# Patient Record
Sex: Female | Born: 1960 | Race: Black or African American | Hispanic: No | Marital: Single | Smoking: Never smoker
Health system: Southern US, Community
[De-identification: ages and names within clinical notes are randomized; demographics above are authoritative.]

## PROBLEM LIST (undated history)

## (undated) DIAGNOSIS — E890 Postprocedural hypothyroidism: Secondary | ICD-10-CM

## (undated) HISTORY — DX: Postprocedural hypothyroidism: E89.0

---

## 2000-04-05 ENCOUNTER — Encounter (INDEPENDENT_AMBULATORY_CARE_PROVIDER_SITE_OTHER): Payer: Self-pay | Admitting: Specialist

## 2000-04-05 ENCOUNTER — Other Ambulatory Visit: Admission: RE | Admit: 2000-04-05 | Discharge: 2000-04-05 | Payer: Self-pay | Admitting: Obstetrics and Gynecology

## 2001-08-09 ENCOUNTER — Other Ambulatory Visit: Admission: RE | Admit: 2001-08-09 | Discharge: 2001-08-09 | Payer: Self-pay | Admitting: Obstetrics and Gynecology

## 2002-03-05 ENCOUNTER — Inpatient Hospital Stay (HOSPITAL_COMMUNITY): Admission: AD | Admit: 2002-03-05 | Discharge: 2002-03-08 | Payer: Self-pay | Admitting: Obstetrics & Gynecology

## 2002-04-12 ENCOUNTER — Other Ambulatory Visit: Admission: RE | Admit: 2002-04-12 | Discharge: 2002-04-12 | Payer: Self-pay | Admitting: Obstetrics & Gynecology

## 2003-01-18 ENCOUNTER — Encounter: Payer: Self-pay | Admitting: Family Medicine

## 2003-01-18 ENCOUNTER — Encounter: Admission: RE | Admit: 2003-01-18 | Discharge: 2003-01-18 | Payer: Self-pay | Admitting: Family Medicine

## 2003-02-05 ENCOUNTER — Encounter: Payer: Self-pay | Admitting: Thoracic Surgery

## 2003-02-07 ENCOUNTER — Encounter (INDEPENDENT_AMBULATORY_CARE_PROVIDER_SITE_OTHER): Payer: Self-pay | Admitting: *Deleted

## 2003-02-07 ENCOUNTER — Encounter: Payer: Self-pay | Admitting: Thoracic Surgery

## 2003-02-07 ENCOUNTER — Inpatient Hospital Stay (HOSPITAL_COMMUNITY): Admission: RE | Admit: 2003-02-07 | Discharge: 2003-02-09 | Payer: Self-pay | Admitting: Thoracic Surgery

## 2003-02-07 ENCOUNTER — Encounter (INDEPENDENT_AMBULATORY_CARE_PROVIDER_SITE_OTHER): Payer: Self-pay | Admitting: Specialist

## 2003-02-08 ENCOUNTER — Encounter: Payer: Self-pay | Admitting: Thoracic Surgery

## 2003-02-09 ENCOUNTER — Encounter: Payer: Self-pay | Admitting: Thoracic Surgery

## 2003-02-15 ENCOUNTER — Encounter: Payer: Self-pay | Admitting: Thoracic Surgery

## 2003-02-15 ENCOUNTER — Encounter: Admission: RE | Admit: 2003-02-15 | Discharge: 2003-02-15 | Payer: Self-pay | Admitting: Thoracic Surgery

## 2003-03-08 ENCOUNTER — Encounter: Admission: RE | Admit: 2003-03-08 | Discharge: 2003-03-08 | Payer: Self-pay | Admitting: Thoracic Surgery

## 2003-03-08 ENCOUNTER — Encounter: Payer: Self-pay | Admitting: Thoracic Surgery

## 2003-03-20 ENCOUNTER — Other Ambulatory Visit: Admission: RE | Admit: 2003-03-20 | Discharge: 2003-03-20 | Payer: Self-pay | Admitting: Obstetrics and Gynecology

## 2003-06-20 ENCOUNTER — Ambulatory Visit (HOSPITAL_COMMUNITY): Admission: RE | Admit: 2003-06-20 | Discharge: 2003-06-20 | Payer: Self-pay | Admitting: Obstetrics and Gynecology

## 2003-06-20 ENCOUNTER — Encounter: Payer: Self-pay | Admitting: Obstetrics and Gynecology

## 2003-06-24 ENCOUNTER — Encounter: Payer: Self-pay | Admitting: Obstetrics and Gynecology

## 2003-06-24 ENCOUNTER — Ambulatory Visit (HOSPITAL_COMMUNITY): Admission: RE | Admit: 2003-06-24 | Discharge: 2003-06-24 | Payer: Self-pay | Admitting: Obstetrics and Gynecology

## 2003-07-09 ENCOUNTER — Encounter: Payer: Self-pay | Admitting: Thoracic Surgery

## 2003-07-09 ENCOUNTER — Encounter: Admission: RE | Admit: 2003-07-09 | Discharge: 2003-07-09 | Payer: Self-pay | Admitting: Thoracic Surgery

## 2003-08-02 ENCOUNTER — Inpatient Hospital Stay (HOSPITAL_COMMUNITY): Admission: RE | Admit: 2003-08-02 | Discharge: 2003-08-02 | Payer: Self-pay | Admitting: Obstetrics and Gynecology

## 2003-08-02 ENCOUNTER — Encounter: Payer: Self-pay | Admitting: Obstetrics and Gynecology

## 2003-08-03 ENCOUNTER — Inpatient Hospital Stay (HOSPITAL_COMMUNITY): Admission: AD | Admit: 2003-08-03 | Discharge: 2003-08-03 | Payer: Self-pay | Admitting: Obstetrics and Gynecology

## 2003-10-01 ENCOUNTER — Ambulatory Visit (HOSPITAL_COMMUNITY): Admission: RE | Admit: 2003-10-01 | Discharge: 2003-10-01 | Payer: Self-pay | Admitting: Obstetrics and Gynecology

## 2003-10-08 ENCOUNTER — Encounter: Admission: RE | Admit: 2003-10-08 | Discharge: 2003-10-08 | Payer: Self-pay | Admitting: Thoracic Surgery

## 2003-11-04 ENCOUNTER — Inpatient Hospital Stay (HOSPITAL_COMMUNITY): Admission: AD | Admit: 2003-11-04 | Discharge: 2003-11-06 | Payer: Self-pay | Admitting: Obstetrics and Gynecology

## 2004-01-09 ENCOUNTER — Encounter: Admission: RE | Admit: 2004-01-09 | Discharge: 2004-01-09 | Payer: Self-pay | Admitting: Thoracic Surgery

## 2004-02-14 HISTORY — PX: THYROIDECTOMY: SHX17

## 2004-02-27 ENCOUNTER — Encounter (INDEPENDENT_AMBULATORY_CARE_PROVIDER_SITE_OTHER): Payer: Self-pay | Admitting: *Deleted

## 2004-02-27 ENCOUNTER — Inpatient Hospital Stay (HOSPITAL_COMMUNITY): Admission: RE | Admit: 2004-02-27 | Discharge: 2004-03-03 | Payer: Self-pay | Admitting: Thoracic Surgery

## 2004-03-10 ENCOUNTER — Encounter: Admission: RE | Admit: 2004-03-10 | Discharge: 2004-03-10 | Payer: Self-pay | Admitting: Thoracic Surgery

## 2004-03-31 ENCOUNTER — Encounter: Admission: RE | Admit: 2004-03-31 | Discharge: 2004-03-31 | Payer: Self-pay | Admitting: Thoracic Surgery

## 2004-04-28 ENCOUNTER — Encounter: Admission: RE | Admit: 2004-04-28 | Discharge: 2004-04-28 | Payer: Self-pay | Admitting: Thoracic Surgery

## 2004-08-20 ENCOUNTER — Encounter: Admission: RE | Admit: 2004-08-20 | Discharge: 2004-08-20 | Payer: Self-pay | Admitting: Thoracic Surgery

## 2004-12-10 ENCOUNTER — Other Ambulatory Visit: Admission: RE | Admit: 2004-12-10 | Discharge: 2004-12-10 | Payer: Self-pay | Admitting: Obstetrics and Gynecology

## 2005-03-22 ENCOUNTER — Ambulatory Visit (HOSPITAL_COMMUNITY): Admission: RE | Admit: 2005-03-22 | Discharge: 2005-03-22 | Payer: Self-pay | Admitting: Obstetrics and Gynecology

## 2005-09-23 ENCOUNTER — Ambulatory Visit (HOSPITAL_COMMUNITY): Admission: RE | Admit: 2005-09-23 | Discharge: 2005-09-23 | Payer: Self-pay | Admitting: Surgery

## 2005-10-04 ENCOUNTER — Ambulatory Visit (HOSPITAL_COMMUNITY): Admission: RE | Admit: 2005-10-04 | Discharge: 2005-10-04 | Payer: Self-pay | Admitting: Surgery

## 2005-12-20 ENCOUNTER — Other Ambulatory Visit: Admission: RE | Admit: 2005-12-20 | Discharge: 2005-12-20 | Payer: Self-pay | Admitting: Obstetrics and Gynecology

## 2006-01-12 ENCOUNTER — Encounter: Admission: RE | Admit: 2006-01-12 | Discharge: 2006-01-12 | Payer: Self-pay | Admitting: Obstetrics and Gynecology

## 2006-06-20 ENCOUNTER — Ambulatory Visit: Payer: Self-pay | Admitting: Family Medicine

## 2007-01-11 ENCOUNTER — Ambulatory Visit: Payer: Self-pay | Admitting: Family Medicine

## 2007-01-11 LAB — CONVERTED CEMR LAB
Hemoglobin, Urine: NEGATIVE
Nitrite: NEGATIVE
Protein, ur: NEGATIVE mg/dL
Specific Gravity, Urine: 1.018 (ref 1.005–1.03)
Urobilinogen, UA: 0.2 (ref 0.0–1.0)

## 2007-01-12 ENCOUNTER — Encounter (INDEPENDENT_AMBULATORY_CARE_PROVIDER_SITE_OTHER): Payer: Self-pay | Admitting: Family Medicine

## 2007-01-13 ENCOUNTER — Encounter: Admission: RE | Admit: 2007-01-13 | Discharge: 2007-01-13 | Payer: Self-pay | Admitting: Obstetrics and Gynecology

## 2007-10-17 ENCOUNTER — Ambulatory Visit: Payer: Self-pay | Admitting: Family Medicine

## 2007-10-17 DIAGNOSIS — E039 Hypothyroidism, unspecified: Secondary | ICD-10-CM

## 2007-12-20 ENCOUNTER — Ambulatory Visit: Payer: Self-pay | Admitting: Family Medicine

## 2007-12-22 ENCOUNTER — Telehealth (INDEPENDENT_AMBULATORY_CARE_PROVIDER_SITE_OTHER): Payer: Self-pay | Admitting: *Deleted

## 2008-02-22 ENCOUNTER — Ambulatory Visit: Payer: Self-pay | Admitting: Family Medicine

## 2008-02-25 LAB — CONVERTED CEMR LAB
T3 Uptake Ratio: 42.6 % — ABNORMAL HIGH (ref 22.5–37.0)
T3, Free: 3 pg/mL (ref 2.3–4.2)
TSH: 0.04 microintl units/mL — ABNORMAL LOW (ref 0.35–5.50)

## 2008-02-26 ENCOUNTER — Encounter (INDEPENDENT_AMBULATORY_CARE_PROVIDER_SITE_OTHER): Payer: Self-pay | Admitting: *Deleted

## 2009-03-13 ENCOUNTER — Encounter: Admission: RE | Admit: 2009-03-13 | Discharge: 2009-03-13 | Payer: Self-pay | Admitting: Internal Medicine

## 2010-03-03 ENCOUNTER — Ambulatory Visit: Payer: Self-pay | Admitting: Vascular Surgery

## 2010-03-03 ENCOUNTER — Ambulatory Visit (HOSPITAL_COMMUNITY): Admission: RE | Admit: 2010-03-03 | Discharge: 2010-03-03 | Payer: Self-pay | Admitting: Internal Medicine

## 2010-05-13 ENCOUNTER — Ambulatory Visit (HOSPITAL_COMMUNITY): Admission: RE | Admit: 2010-05-13 | Discharge: 2010-05-13 | Payer: Self-pay | Admitting: Internal Medicine

## 2010-05-13 ENCOUNTER — Ambulatory Visit: Payer: Self-pay | Admitting: Vascular Surgery

## 2010-05-13 ENCOUNTER — Encounter (INDEPENDENT_AMBULATORY_CARE_PROVIDER_SITE_OTHER): Payer: Self-pay | Admitting: Internal Medicine

## 2010-06-09 ENCOUNTER — Encounter: Admission: RE | Admit: 2010-06-09 | Discharge: 2010-06-09 | Payer: Self-pay | Admitting: Internal Medicine

## 2010-12-05 ENCOUNTER — Encounter: Payer: Self-pay | Admitting: Thoracic Surgery

## 2011-04-02 NOTE — H&P (Signed)
[facility]  Patient:    Yesenia Salas, Yesenia Salas Visit Number: [id number] MRN: [id number]          Service Type: OBS Location: [facility] Attending Physician:  Esmeralda Arthur Dictated by:   Lenoard Aden, M.D. Adm. Date:  03/05/02                           History and Physical  CHIEF COMPLAINT:              Post-dates pregnancy, for induction.  HISTORY OF PRESENT ILLNESS:   A 50 year old African-American female, G1, P0, EDD February 22, 2002, who presents post dates for induction.  PAST MEDICAL HISTORY:         Remarkable for no pregnancies.  ALLERGIES:                    No known drug allergies.  MEDICATIONS:                  Prenatal vitamins.  GYNECOLOGIC HISTORY:          History of intramural uterine fibroids.  FAMILY HISTORY:               Hypertension.  SOCIAL HISTORY:               Nonsmoker, nondrinker.  Denies domestic or physical violence.  PRENATAL LABORATORY DATA:     Blood type AB positive.  Hemoglobin electrophoresis within normal limits.  Rubella immune.  Hepatitis B surface antigen negative.  HIV nonreactive.  GC, Chlamydia negative.  PHYSICAL EXAMINATION:  GENERAL:                      She is a well-developed, well-nourished African-American female in no apparent distress.  HEENT:                        Normal.  CHEST:                        Lungs clear.  CARDIAC:                      Regular rate and rhythm.  ABDOMEN:                      Soft, gravid, nontender.  PELVIC:                       Cervix is closed, 3 cm, long, soft.  Vertex -2.  EXTREMITIES:                  No cords.  NEUROLOGIC:                   Nonfocal.  IMPRESSION:                   1. Post-dates intrauterine pregnancy, for                                  cervical ripening and induction.                               2. Advanced maternal age, declined  amniocentesis.  PLAN:                         Proceed with  cervical ripening and induction. Cytotec 25 mcg to be placed.  Fetal heart rate tracing reassuring at this time.  The procedure is explained to the patient.  She acknowledges and will proceed. Dictated by:   Lenoard Aden, M.D. Attending Physician:  Esmeralda Arthur DD:  03/05/02 TD:  03/05/02 Job: [id number]

## 2011-04-02 NOTE — H&P (Signed)
NAME:  Yesenia Salas, Yesenia Salas                           ACCOUNT NO.:  [id number]   MEDICAL RECORD NO.:  [id number]                   PATIENT TYPE:  INP   LOCATION:  9169                                 FACILITY:  [facility]   PHYSICIAN:  Crist Fat. Rivard, M.D.              DATE OF BIRTH:  12-18-1960   DATE OF ADMISSION:  11/04/2003  DATE OF DISCHARGE:                                HISTORY & PHYSICAL   HISTORY OF PRESENT ILLNESS:  Yesenia Salas is a 50 year old married black  female gravida 3 para 1-0-1-1 who presents for induction of labor secondary  to large substernal mass in order to promote a daytime delivery and to have  the full obstetrical team available.  The patient denies any leaking or  vaginal bleeding.  She denies any nausea, vomiting, headaches, or visual  disturbances.  She does report positive fetal movement.  Her pregnancy has  been followed at [facility] by the M.D. service and has been  complicated by a large mediastinal mass that has been diagnosed as a goiter.  It has been stable throughout this pregnancy but is causing some tracheal  compression.  Other complications of this pregnancy include advanced  maternal age, history of fibroids, and increased Downs syndrome risk on quad  screen and normal ultrasound.   OBSTETRICAL AND GYNECOLOGICAL HISTORY:  She is a gravida 3 para 1-0-1-1 who  had a miscarriage in 1984 and delivered a viable female infant in April 2003  who weighed 8 pounds 2 ounces at [redacted] weeks gestation following a 24-hour  labor.  She was induced secondary to being overdue and was born at [facility] [place].  The baby's name is Musa.  Other GYN history  significant for history of fibroids.   GENERAL MEDICAL HISTORY:  She has no known drug allergies.  She reports  having had the usual childhood diseases.  She has no medical problems other  than the chest mass from a goiter that has been followed by Dr. Edwyna Shell.  Her  only surgical history was  for wisdom teeth in 1999.   GENETIC HISTORY:  Significant for the fact that she is over age 2 and her  husband is over age 16.   SOCIAL HISTORY:  She is married to Gary who is involved and  supportive.  They are of the Islamic faith.  They speak English and a  language called [place]er[place] but do not require an interpreter.   PRENATAL LABORATORY DATA:  Blood type is AB positive, antibody screen is  negative.  Sickle cell trait is negative.  Syphilis is nonreactive.  Rubella  is positive.  Hepatitis B surface antigen is negative.  HIV is nonreactive.  GC and chlamydia both negative.  Pap was within normal limits.  Her quad  screen showed elevated Downs syndrome risk but she declined amniocentesis.  Ultrasound at [facility] was within normal limits.  Her 36-week beta  strep was negative.   PHYSICAL EXAMINATION:  VITAL SIGNS:  Stable; she is afebrile.  HEENT:  Grossly within normal limits.  HEART:  Regular rhythm and rate.  CHEST:  Clear.  BREASTS:  Soft and nontender.  ABDOMEN:  Gravid with uterine contractions that are irregular.  Fetal heart  rate is reactive and reassuring.  PELVIC:  Shows 1 cm, 50%, posterior, vertex at -3.  EXTREMITIES:  Within normal limits.   ASSESSMENT:  1. Intrauterine pregnancy at [redacted] weeks gestation with large substernal     goiter.  2. Negative group B Streptococcus.   PLAN:  Admit for induction of labor with artificial rupture of membranes and  Pitocin per Dr. Dois Davenport Rivard.     Concha Pyo. Duplantis, C.N.M.              Crist Fat Rivard, M.D.    SJD/MEDQ  D:  11/04/2003  T:  11/04/2003  Job:  [id number]

## 2011-04-02 NOTE — Op Note (Signed)
NAME:  Yesenia Salas, Yesenia Salas                           ACCOUNT NO.:  [id number]   MEDICAL RECORD NO.:  [id number]                   PATIENT TYPE:  INP   LOCATION:  2899                                 FACILITY:  [facility]   PHYSICIAN:  Ines Bloomer, M.D.              DATE OF BIRTH:  August 05, 1961   DATE OF PROCEDURE:  02/07/2003  DATE OF DISCHARGE:                                 OPERATIVE REPORT   PREOPERATIVE DIAGNOSIS:  Anterior mediastinal mass.   POSTOPERATIVE DIAGNOSIS:  Anterior mediastinal mass, probable substernal  thyroid goiter.   OPERATION:  Video bronchoscopy and left mediastinotomy.   DESCRIPTION OF PROCEDURE:  After general anesthesia, the fiberoptic  bronchoscope was passed through the endotracheal tube.  The carina was in  the midline; however, there was evidence of distal compression of the  trachea.  Pictures were taken of this.  The right upper lobe, right middle  lobe, and right lower lobe orifices were normal.  The left upper lobe and  left lower lobe orifices were normal.  Cytologies and cultures were taken  and the videoscope was removed.  Then, the anterior neck and chest were  prepped and draped in the usual sterile manner.  This patient presented with  symptoms of a cough and mild dyspnea.  A chest x-ray revealed an anterior  mediastinal mass.  It was thought that this might be a thymoma, germ cell  tumor, or lymphoma.  We decided to do a direct biopsy to determine what was  going on, particularly if it was a lymphoma or germ cell tumor in order to  get an adequate amount tissue for workup.  A 3-cm incision was made over the  second intercostal space at the left sternal border and dissection was  carried down to the subcutaneous tissue.  The pectoral muscles were split  and the second intercostal space was entered.  The lesion was then seen and  you could identify the phrenic nerve laterally and the lesion was opened  medially to the phrenic nerve and lateral to  the mammary artery and  dissection was carried down.  There were a lot of veins on top of the mass.  One of the veins was sutured with 3-0 silk and clipped and divided to expose  the mass.  It looked to be reddish in color and looked to be thyroid.  A  substernal thyroid goiter was suspected.  Several biopsies were done and  frozen section revealed that this is thyroid tissue.  The bleeding was  controlled with electrocautery.  Surigicel was packed into the biopsy site.  A chest tube was brought in laterally and put into the intercostal space.  The chest was closed with 2-0 Vicryl in the subcutaneous tissue, 3-0 Vicryl  with a subcuticular stitch, and Dermabond to the skin.  The patient was  returned to the recovery room in stable condition.  Ines Bloomer, M.D.    DPB/MEDQ  D:  02/07/2003  T:  02/08/2003  Job:  [id number]   cc:   Leanne Chang, M.D.  [address]  [place]  [place] [address]  Fax: [phone]

## 2011-04-02 NOTE — Op Note (Signed)
NAME:  Yesenia Salas, Yesenia Salas                           ACCOUNT NO.:  [id number]   MEDICAL RECORD NO.:  [id number]                   PATIENT TYPE:  INP   LOCATION:  3314                                 FACILITY:  [facility]   PHYSICIAN:  Velora Heckler, M.D.                DATE OF BIRTH:  26-Feb-1961   DATE OF PROCEDURE:  02/27/2004  DATE OF DISCHARGE:                                 OPERATIVE REPORT   PREOPERATIVE DIAGNOSIS:  Thyroid goiter with large substernal component.   POSTOPERATIVE DIAGNOSIS:  Thyroid goiter with large substernal component.   PROCEDURE:  1. Total thyroidectomy.  2. Median sternotomy.  3. Resection substernal goiter.   CO-SURGEONS:  Ines Bloomer, M.D.  Velora Heckler, M.D.   ANESTHESIA:  General.   ESTIMATED BLOOD LOSS:  500 mL.   PREPARATION:  Betadine.   COMPLICATIONS:  None.   INDICATIONS FOR PROCEDURE:  The patient is a 50 year old black female  referred by Dr. Dewayne Shorter to my practice for evaluation of thyroid goiter  with substernal mass.  The patient had developed a chronic cough in early  2004.  Chest x-ray obtained by her primary care physician showed a large  mediastinal mass.  The patient was seen by Dr. Edwyna Shell.  CT scan shows a  large substernal mass suspicious for substernal thyroid goiter.  Needle  biopsy was obtained and showed changes consistent with hyperplastic nodules.  However, the patient became pregnant.  She carried to term and delivered her  child in December 2004.  She returned to see Dr. Edwyna Shell where a CT scan of  the chest showed interval enlargement of the mediastinal mass to a maximum  dimension of approximately 14 cm.  The patient now comes to surgery for  total thyroidectomy and resection of mediastinal goiter.   DESCRIPTION OF PROCEDURE:  The procedure was done in OR 7 at [facility].  The patient was brought to the operating room and placed  in a supine position on the operating table.  Following  administration of  general anesthesia, the patient was prepped and draped in the usual strict  aseptic fashion.  After ascertaining an adequate level of anesthesia had  been obtained, a Kocher incision was made with a #15 blade.  Dissection was  carried down through subcutaneous tissues and platysma.  Hemostasis was  obtained with electrocautery.  There was marked engorgement of the external  jugular veins.  Skin flaps were developed cephalad and caudad from the  thyroid notch to the sternal notch border.  A Mahorner self-retaining  retractor was placed for exposure.  Strap muscles were incised in the  midline and dissection was begun on the left side.  The strap muscles were  reflected laterally.  The left thyroid lobe was carefully dissected out.  Large venous tributaries were ligated in continuity with 3-0 silk ties and  medium Liga clips and  divided.  The superior pole vessels are dissected out.  These are ligated in continuity with 2-0 silk ties and medium Liga clips and  divided.  The superior parathyroid gland is identified and preserved on the  left.  The gland is rolled medially.  Recurrent laryngeal nerve is  identified and preserved.  Branches of the inferior thyroid artery are  divided between small Liga clips.  The ligament of Allyson Sabal is transected and  the gland is rolled up and onto the anterior surface of the trachea.  The  inferior pole of the gland extends beneath the clavicle consistent with  substernal goiter.  A pack is placed on the left neck.   Next, we turned our attention to the right side.  Again, the strap muscles  are reflected laterally.  Large venous tributaries are ligated in continuity  with 3-0 silk ties and medium Liga clips and divided.  The superior pole  vessels are dissected out, ligated in continuity with 2-0 silk ties and  medium Liga clips, and divided.  The gland is rolled medially.  The  parathyroid tissue was identified and preserved.  Branches of  the inferior  thyroid artery are divided between small and medium Liga clips.  The  ligament of Allyson Sabal is transected and the gland was rolled up and onto the  anterior trachea.  It is excised off the anterior trachea using the  electrocautery for hemostasis.  Dissection was carried inferiorly.  The  inferior pole appears to blend into the substernal goiter component.  Care  was taken to avoid injury to recurrent nerves.  After complete mobilization  of the cervical thyroid, the substernal component was addressed.  After  careful digital palpation in the plane around the substernal portion of the  goiter, it is noted that the substernal portion is quite large, well  exceeding the 10 cm reach of an index finger.  Also, the gland appears  somewhat fixed in the left anterior chest and this is not able to be  visualized through the cervical approach.  Therefore, a decision is made to  proceed with median sternotomy.  This will be dictated under a separate  operative report dictated by Dr. Karle Plumber.  He will also dictate the  resection of the mediastinal mass at that time.  The patient tolerated the  first portion of this procedure very well.   Following median sternotomy and resection of the mediastinal component of  the thyroid goiter, the wounds were closed.  The median sternotomy was  closed as dictated by Dr. Edwyna Shell.  The cervical incision was closed by  placing Surgicel over the area of the recurrent laryngeal nerves  bilaterally.  The strap muscles were reapproximated in the midline with  interrupted 3-0 Vicryl sutures.  The platysma was closed with interrupted 3-  0 Vicryl sutures.  The skin was closed with stainless steel staples.  Dr.  Edwyna Shell will dictate closure of the median sternotomy under a separate  report.   The patient tolerated the procedure well.                                               Velora Heckler, M.D.   TMG/MEDQ  D:  02/27/2004  T:  02/27/2004  Job:   [id number]   cc:   Ines Bloomer, M.D.  [address]  [place]  [place] [address]   [facility]

## 2011-04-02 NOTE — Consult Note (Signed)
NAME:  Yesenia Salas, Yesenia Salas                           ACCOUNT NO.:  [id number]   MEDICAL RECORD NO.:  [id number]                   PATIENT TYPE:  INP   LOCATION:  9102                                 [facility]ACILITY[facility]   PHYSICIAN:  Lorre Munroe., M.D.            DATE OF BIRTH:  1961/08/05   DATE OF CONSULTATION:  11/05/2003  DATE OF DISCHARGE:                                   CONSULTATION   CONSULTING PHYSICIAN:  Lebron Conners, M.D.   REFERRING PHYSICIAN:  Dois Davenport A. Rivard, M.D.   CHIEF COMPLAINT:  Anal pain.   HISTORY OF PRESENT ILLNESS:  The patient is a recently postpartum 50-year-  old black female who has developed rather severe anal pain which is made  worse with each bowel movement.  Since delivery, she has not been  constipated.  She is anticipating going home from the hospital tomorrow.  She has not had any bleeding.  She has had a similar problem but not quite  as severe with each proceeding pregnancy.  She does have constipation from  time to time but has no chronic GI problems.  She is a healthy woman and has  no serious chronic ailments.  The delivery went well.  The baby is healthy.   PAST MEDICAL HISTORY:  As above.  No serious problems noted.   FAMILY HISTORY:  Unremarkable.   CHILDHOOD ILLNESSES:  Unremarkable.   No previous anal surgery or GI surgery.   PHYSICAL EXAMINATION:  VITAL SIGNS:  The patient has normal vital signs.  Normal temperature.  GENERAL:  No acute distress.  She appears healthy.  MENTAL STATUS:  Normal.  ABDOMEN:  Nontender, nondistended.  RECTAL:  The perianal skin shows some edematous but not particularly  remarkable external hemorrhoids which do not show evidence of thrombosis as  manifested by hard areas of tightness or any induration.  There is no  redness.  No evident drainage of pus.  Digital exam is difficult, but I can  tell that there is no impaction.   IMPRESSION:  Delivery related edematous hemorrhoids.   RECOMMENDATIONS:  I  think this is best treated by conservative measures of  topical cream, cyst baths and soaks and good hygiene, high fiber diet, use  of laxatives judiciously.  I gave the patient my card and will be happy to  follow her up in the office if there is not prompt improvement which should  be expected.                                               Lorre Munroe., M.D.    WB/MEDQ  D:  11/05/2003  T:  11/05/2003  Job:  [id number]   cc:   Dois Davenport A. Rivard, M.D.  [address]  [place] [address]  Fax: [phone]

## 2011-04-02 NOTE — Discharge Summary (Signed)
NAME:  Yesenia Salas, Yesenia Salas                           ACCOUNT NO.:  [id number]   MEDICAL RECORD NO.:  [id number]                   PATIENT TYPE:  INP   LOCATION:  2019                                 FACILITY:  [facility]   PHYSICIAN:  Ines Bloomer, M.D.              DATE OF BIRTH:  August 24, 1961   DATE OF ADMISSION:  02/27/2004  DATE OF DISCHARGE:  03/03/2004                                 DISCHARGE SUMMARY   HISTORY OF PRESENT ILLNESS:  Yesenia Salas is a very pleasant 49 year old black  female referred for evaluation of a thyroid goiter and substernal mass.  The  patient initially developed a chronic cough in the early part of 2004.  Her  primary care physician treated her; however, when the cough failed to  resolve, a chest x-ray was obtained which revealed a mediastinal mass.  She  was initially referred to Dr. Edwyna Shell, and CT scan at that time showed a  large substernal mass suspicious for substernal thyroid goiter.  Needle  biopsy was obtained which showed changes consistent with hyperplastic  nodules and thyroid goiter.  The patient did, however, become pregnant  before the mass could be resected.  She carried her child to term and  delivered in December 2004.  She was subsequently reevaluated by Dr. Edwyna Shell  as well as Dr. Gerrit Friends and felt to be a candidate for resection of the mass  this hospitalization.   PAST MEDICAL HISTORY:  Childbirth December 2004.   MEDICATIONS ON ADMISSION:  None.   ALLERGIES:  None.   Family History, Social History, Review of Symptoms, and Physical  Examination:  Please see the History and Physical done at the time of  admission.   HOSPITAL COURSE:  The patient was admitted electively on 27 February 2004 and  taken to the operating room where she underwent the following procedures: 1)  Total thyroidectomy, 2) median sternotomy, 3) resection of substernal  goiter.  The patient had the cosurgeons of Dr. Gerrit Friends and Dr. Edwyna Shell,  tolerated the procedure well, and  was taken the to the postanesthesia care  unit instable condition.   Postoperative Hospital Course:  The patient has done quite well.  All  routine lines, monitors, and drainage devices have been discontinue in the  standard fashion.  She does have a postoperative anemia.  She has been  started on iron replacement.  Her calcium has been monitored closely  postoperatively, and she has been started on supplementation.  Most recent  calcium is 9.2 on March 01, 2004, and an additional check will be obtained  prior to final discharge.  Pathology has returned, and the final diagnosis  is multinodular goiter with no evidence of malignancy.  The patient has been  started on synthetic thyroid replacement.  Currently she is hemodynamically  stable.  Incision is healing well without signs of infection.  She is  tolerating diet and other activities commensurate for  level of postoperative  convalescence and overall is tentatively felt to be quite stable for  discharge in the morning of March 03, 2004, pending morning round  reevaluation.   CONDITION ON DISCHARGE:  Stable and improving.   DISCHARGE MEDICATIONS:  1. Synthroid 100 mcg daily.  2. Calcium carbonate with vitamin D 1000 mg daily.  3. Iron sulfate 325 mg twice daily with meals.  4. Colace p.r.n. as needed for pain.  5  Tylox 1 to 2 every 4 to 6 hours as needed.   DISCHARGE INSTRUCTIONS:  The patient received written instructions in regard  to medications, activity, diet, wound care, and followup.  Followup will  include Dr. Gerrit Friends at his office, to be arranged; Dr. Edwyna Shell April 26 at  3:45 with a chest x-ray from the [facility]   FINAL DIAGNOSES:  1. Substernal goiter, multinodular goiter.  No evidence of malignancy.  2. Postoperative anemia.  3. Most recent hemoglobin and hematocrit dated March 01, 2004, are 8.4 and     24.7, respectively.      Rowe Clack, P.A.-C.                    Ines Bloomer,  M.D.    Sherryll Burger  D:  03/02/2004  T:  03/03/2004  Job:  [id number]   cc:   Velora Heckler, M.D.  [address] [place]  [place] [address]  Fax: [phone]   [facility], [place]

## 2011-04-02 NOTE — H&P (Signed)
NAME:  Yesenia Salas, Yesenia Salas                          ACCOUNT NO.:  [id number]   MEDICAL RECORD NO.:  [id number]                   PATIENT TYPE:  INP   LOCATION:  NA                                   FACILITY:  [facility]   PHYSICIAN:  Ines Bloomer, M.D.              DATE OF BIRTH:  Dec 22, 1960   DATE OF ADMISSION:  02/07/2003  DATE OF DISCHARGE:                                HISTORY & PHYSICAL   CHIEF COMPLAINT:  Chronic cough, shortness of breath, and anterior  mediastinal mass.   HISTORY OF PRESENT ILLNESS:  This is a 50 year old black female, originally  from [place], who has had complaints of a cough for around one year.  It has  been nonproductive.  Over the last six to eight weeks, the cough has gotten  much worse and she has had mild associated shortness of breath.  She was  referred to Dr. Ines Bloomer after a chest CT showed an anterior  mediastinal lung mass.  After review, Dr. Ines Bloomer felt that there  may be some tracheal compression and that an open biopsy was needed.  He  discussed this with her and it is agreed to proceed with a bronchoscopy and  mediastinoscopy or a Chamberland procedure on February 07, 2003.   Today, the patient has no new complaints and has been feeling in her usual  state of health except for her worsening cough and shortness of breath.   PAST MEDICAL HISTORY:  Essentially negative.  She has denied any history of  heart disease, hypertension, diabetes, COPD, asthma, GI problems, GU  problems, stroke.   PAST SURGICAL HISTORY:  Also negative except for oral surgery.   SOCIAL HISTORY:  She has never been a smoker nor uses alcohol.  She moved  here from [place] in [place] eight years ago.  She is married for one year now  and has a 44-year-old son.  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  She lives here in [place].   FAMILY HISTORY:  Mother is 49 years old and healthy.  Father died at age 46  from  unknown cause.  She has five brothers and seven sisters-all are  reportedly healthy.   REVIEW OF SYMPTOMS:  Positive for chronic cough and shortness of breath.  She denies fever, chills, night time sweats, weight loss, chest pain,  palpitations, GI problems, GU problems, stroke, TIA symptoms, paroxysm  nocturnal dyspnea, palpitations, lower extremity edema.   MEDICATIONS:  None.   ALLERGIES:  None.   PHYSICAL EXAMINATION:  GENERAL:  Well-developed, well-nourished pleasant  black female in no acute distress.  Alert and oriented x 3.  VITAL SIGNS:  Blood pressure 128/76, heart rate 90 and regular, respirations  14 and unlabored.  HEENT:  Normocephalic and atraumatic.  Pupils are equal, round, and reactive  to light.  Extraocular movements intact.  Visual  acuity is intact.  Oropharynx is grossly normal.  There are no dentures.  NECK:  Supple.  No adenopathy.  There are 2+ carotids with no bruits.  LUNGS:  Clear and equal anteriorly and posteriorly.  CHEST:  Symmetrical.  There are no adventitious lung sounds.  Breathing is  normal.  CARDIOVASCULAR:  Regular rate and rhythm.  S1 and S2 with no murmur.  PULSES:  There are 2+ pedal pulses.  There is no lower extremity edema.  ABDOMEN:  Soft.  No hepatosplenomegaly.  Positive bowel sounds.  Nontender,  nondistended, and no masses.  NEUROLOGICAL:  Cranial nerves are intact.  There were no focal deficits.  MUSCULOSKELETAL:  There is +5/5 strength throughout.  SKIN:  Normal.   ASSESSMENT/PLAN:  This is a 50 year old black female with an anterior  mediastinal mass and associated cough and shortness of breath.  Dr. Ines Bloomer has recommended bronchoscopy and mediastinoscopy or possibly a  Chamberland procedure.  This is scheduled for February 07, 2003 in order to get  a diagnosis of this mass.     Lissa Merlin, P.A.                          Ines Bloomer, M.D.    Alwyn Ren  D:  02/05/2003  T:  02/05/2003  Job:  [id number]

## 2011-04-02 NOTE — Op Note (Signed)
NAME:  Yesenia Salas, Yesenia Salas                           ACCOUNT NO.:  [id number]   MEDICAL RECORD NO.:  [id number]                   PATIENT TYPE:  INP   LOCATION:  3314                                 FACILITY:  [facility]   PHYSICIAN:  Ines Bloomer, M.D.              DATE OF BIRTH:  03-08-1961   DATE OF PROCEDURE:  02/27/2004  DATE OF DISCHARGE:                                 OPERATIVE REPORT   PREOPERATIVE DIAGNOSIS:  Massive substernal thyroid goiter, multinodular  goiter.   POSTOPERATIVE DIAGNOSIS:  Massive substernal thyroid goiter, multinodular  goiter.   PROCEDURE:  Resection total thyroidectomy with resection of goiter.   SURGEON:  Ines Bloomer, M.D. and Velora Heckler, M.D.   INDICATIONS FOR PROCEDURE:  This 50 year old African-American female was  found to have an enlarging substernal mass.  The biopsy done revealed  thyroid tissue.  Unfortunately, we could not do the case because she was  pregnant.  We waited until she had delivered and rechecked the CT scan which  showed the mass increased 2 cm from 12 to 14 cm.  It was decided to do a  total thyroidectomy and median sternotomy was thought would have to be done.   DESCRIPTION OF PROCEDURE:  After general anesthesia and prepping and draping  the chest, a neck incision was made by Dr. Gerrit Friends and dissection was carried  down dissecting the strap muscles and Dr. Gerrit Friends who will dictate this  separately dissected out the inferior pole of the thyroid and down to the  inferior pole of the thyroid.  Off the inferior pole of the thyroid, the  substernal goiter could be seen and went below the sternum, but could not be  removed without doing a sternotomy.  An incision was made T-ing off the  Kocher incision and going down to the xiphoid.  The incision was divided  with electrocautery down to the sternum.  The sternum was then split with  the sternal saw and retractor was inserted after stopping all bleeding with  electrocoagulation and thrombin and Gelfoam.  After this had been done, the  lesion was dissected out, first dissecting out on the right side capsule.  There were several large blood vessels that had to be clamped and ligated  with 2-0 and 0 silk and dissection was carried to the left at the previous  biopsy but it was stuck to the area of the previous biopsy. This had to be  dissected off.  The innominate vein went right under the area of the  previous biopsy and this had to be dissected away until finally the whole  mass was removed.  Multiple large vessels that were feeding the mass were  clamped with hemostats and Kelly clamps as well as Vanderbilt.  After the  entire mass was removed, these clamps were tied with 0 and 2-0 silk.  One  area was bleeding where the previous  biopsy was and had to be oversewn with  2-0 silk in a horizontal mattress fashion as well as a figure-of-eight  fashion.  After all bleeding had been controlled, two chest tubes were  brought in through the separate stab wounds in the inferior portion of the  wound. These were 28 chest tubes that were placed with 0 silk.  The chest  was then closed with six sternal wires, #1 Vicryl in the muscle layer, and  so the fascia was  closed with #1 Vicryl, the On-Q catheters were placed underneath the fascia  stitches, and then the subcutaneous tissue closed with 2-0 Vicryl and the  skin with 3-0 Vicryl and Visistat staples.  Dry sterile dressing applied.  The patient returned to the recovery room in stable condition.                                               Ines Bloomer, M.D.    DPB/MEDQ  D:  02/27/2004  T:  02/28/2004  Job:  [id number]   cc:   Velora Heckler, M.D.  [address] [place]  [place] [address]  Fax: [phone]

## 2011-04-02 NOTE — Discharge Summary (Signed)
   NAME:  Yesenia Salas, Yesenia Salas                           ACCOUNT NO.:  [id number]   MEDICAL RECORD NO.:  [id number]                   PATIENT TYPE:  INP   LOCATION:  2034                                 FACILITY:  [facility]   PHYSICIAN:  Ines Bloomer, M.D.              DATE OF BIRTH:  05/22/1961   DATE OF ADMISSION:  02/07/2003  DATE OF DISCHARGE:  02/09/2003                                 DISCHARGE SUMMARY   FINAL DIAGNOSIS:  Substernal thyroid goiter.   PROCEDURES:  Video bronchoscopy with left mediastinotomy by Dr. Edwyna Shell on  02/07/2003 with biopsy of mass.   BRIEF HISTORY:  The patient is a 50 year old black female from [place] who  has had a long history of cough and shortness of breath.  She was referred  to Dr. Edwyna Shell after an anterior mediastinal mass was seen on a CT scan.  He  felt that there may have been some tracheal compression and that an open  biopsy was needed.   HOSPITAL COURSE:  She was admitted to the hospital for the biopsy on  02/05/2003.  She tolerated the procedure well.  There were no complications.  Frozen section and subsequent pathology revealed substernal thyroid goiter.  On postop day two, she was doing quite well; there were no postoperative  complications, chest x-ray was okay, her wound was healing well, her lungs  were clear, her O2 sat was alright, and she was discharged home in stable  condition.  She was also found to have elevated T3 and T4 with a TSH of  0.35.   DISCHARGE MEDICATIONS:  Tylox one to two every four to six hours p.r.n. for  pain.   CONDITION:  Stable.   DISPOSITION:  Home.   SPECIAL INSTRUCTIONS:  1. She was told to avoid driving, working, heavy lifting, strenuous     activity.  2. She was told that she could shower and clean her wound gently daily with     soap and water, use her breathing exerciser daily.  3. She was told to get a chest x-ray at [facility] one     hour before seeing Dr. Edwyna Shell and to bring it  with her and see Dr.     Edwyna Shell.   FOLLOW UP:  Dr. Edwyna Shell one week after discharge; office will call with  appointment.     Lissa Merlin, P.A.                          Ines Bloomer, M.D.    Alwyn Ren  D:  04/02/2003  T:  04/03/2003  Job:  [id number]

## 2011-07-08 ENCOUNTER — Other Ambulatory Visit: Payer: Self-pay | Admitting: Internal Medicine

## 2011-07-08 DIAGNOSIS — Z1231 Encounter for screening mammogram for malignant neoplasm of breast: Secondary | ICD-10-CM

## 2013-02-26 ENCOUNTER — Encounter (HOSPITAL_COMMUNITY): Payer: Self-pay | Admitting: Emergency Medicine

## 2013-02-26 ENCOUNTER — Emergency Department (HOSPITAL_COMMUNITY)
Admission: EM | Admit: 2013-02-26 | Discharge: 2013-02-26 | Disposition: A | Discharge status: Nursing Home (Includes ICF) | Payer: Medicaid Other | Source: Home / Self Care | Attending: Emergency Medicine | Admitting: Emergency Medicine

## 2013-02-26 ENCOUNTER — Encounter (HOSPITAL_COMMUNITY): Payer: Self-pay | Admitting: *Deleted

## 2013-02-26 ENCOUNTER — Emergency Department (HOSPITAL_COMMUNITY): Payer: Medicaid Other

## 2013-02-26 ENCOUNTER — Emergency Department (HOSPITAL_COMMUNITY)
Admission: EM | Admit: 2013-02-26 | Discharge: 2013-02-26 | Disposition: A | Discharge status: Home / Self Care | Payer: Medicaid Other | Attending: Emergency Medicine | Admitting: Emergency Medicine

## 2013-02-26 DIAGNOSIS — Z79899 Other long term (current) drug therapy: Secondary | ICD-10-CM | POA: Insufficient documentation

## 2013-02-26 DIAGNOSIS — I209 Angina pectoris, unspecified: Secondary | ICD-10-CM

## 2013-02-26 DIAGNOSIS — R5381 Other malaise: Secondary | ICD-10-CM | POA: Insufficient documentation

## 2013-02-26 DIAGNOSIS — J9859 Other diseases of mediastinum, not elsewhere classified: Secondary | ICD-10-CM

## 2013-02-26 DIAGNOSIS — H539 Unspecified visual disturbance: Secondary | ICD-10-CM | POA: Insufficient documentation

## 2013-02-26 DIAGNOSIS — R222 Localized swelling, mass and lump, trunk: Secondary | ICD-10-CM | POA: Insufficient documentation

## 2013-02-26 DIAGNOSIS — R0789 Other chest pain: Secondary | ICD-10-CM | POA: Insufficient documentation

## 2013-02-26 DIAGNOSIS — R209 Unspecified disturbances of skin sensation: Secondary | ICD-10-CM | POA: Insufficient documentation

## 2013-02-26 DIAGNOSIS — R531 Weakness: Secondary | ICD-10-CM

## 2013-02-26 DIAGNOSIS — G459 Transient cerebral ischemic attack, unspecified: Secondary | ICD-10-CM

## 2013-02-26 LAB — CBC
HCT: 35.3 % — ABNORMAL LOW (ref 36.0–46.0)
RDW: 12.4 % (ref 11.5–15.5)
WBC: 5.1 10*3/uL (ref 4.0–10.5)

## 2013-02-26 LAB — BASIC METABOLIC PANEL
BUN: 7 mg/dL (ref 6–23)
CO2: 26 mEq/L (ref 19–32)
Chloride: 103 mEq/L (ref 96–112)
GFR calc non Af Amer: >90 mL/min (ref >90)
Glucose, Bld: 91 mg/dL (ref 70–99)
Potassium: 3.6 mEq/L (ref 3.5–5.1)
Sodium: 140 mEq/L (ref 135–145)

## 2013-02-26 LAB — POCT I-STAT TROPONIN I: Troponin i, poc: 0.02 ng/mL (ref 0.00–0.08)

## 2013-02-26 MED ORDER — GADOBENATE DIMEGLUMINE 529 MG/ML IV SOLN
15.0000 mL | Freq: Once | INTRAVENOUS | Status: AC | PRN
  Administered 2013-02-26: 15 mL via INTRAVENOUS

## 2013-02-26 MED ORDER — ASPIRIN 81 MG PO CHEW
324.0000 mg | CHEWABLE_TABLET | Freq: Once | ORAL | Status: AC
  Administered 2013-02-26: 324 mg via ORAL

## 2013-02-26 MED ORDER — NITROGLYCERIN 0.4 MG SL SUBL
SUBLINGUAL_TABLET | SUBLINGUAL | Status: AC
  Filled 2013-02-26: qty 25

## 2013-02-26 MED ORDER — NITROGLYCERIN 0.4 MG SL SUBL
0.4000 mg | SUBLINGUAL_TABLET | SUBLINGUAL | Status: AC | PRN
  Administered 2013-02-26: 0.4 mg via SUBLINGUAL

## 2013-02-26 MED ORDER — ASPIRIN 81 MG PO CHEW
CHEWABLE_TABLET | ORAL | Status: AC
  Filled 2013-02-26: qty 4

## 2013-02-26 MED ORDER — SODIUM CHLORIDE 0.9 % IV SOLN: INTRAVENOUS | Status: DC

## 2013-02-26 NOTE — ED Notes (Signed)
Per EMS - pt c/o CP and numbness in face X 10 days. Urgent administered 324 ASA. [facility] started a 20G in left forearm and administered 1 nitro. Pt sts she has had relief from pain. BP 127/86 HR 72 RR 16. No cardiac hx.

## 2013-02-26 NOTE — ED Provider Notes (Addendum)
Chief Complaint:   Chief Complaint  Patient presents with  . Numbness    History of Present Illness:   Yesenia Salas is a 52 year old female who presents with paresthesias and weakness of the left face and left arm, and also with chest pain.  The paresthesias have been going on for a week and a half. They involve her entire left face, they come and go lasting for hours. There is no obvious precipitating factor. She also notes weakness in the left side of her face along with this and drooping of the left side of the face. This is associated with weakness and numbness in her left arm as well. She denies any diplopia, but does have some blurry vision that comes and goes although not associated with the numbness and weakness. She's had chronic headaches off-and-on for years. She's also had some weakness of the left leg for over a year associated with numbness. She underwent some testing at the hospital for this, but no definitive cause was found. She denies any difficulty with speaking or swallowing. She denies any problems with coordination or balance. Her gait has been normal, and she denies any passing out or seizure activity.  She also complains of a one-month history of substernal chest pain without radiation. This feels like a tightness. She is having pain right now rated 3/10 in intensity. This last for minutes. It's unrelated to position, exertion, activity, her meals. She denies any associated shortness of breath, nausea, or diaphoresis. She has not had any palpitations, irregular heartbeat, or syncope. She notes no personal or family history of heart disease and does not have any risk factors.  Review of Systems:  Other than noted above, the patient denies any of the following symptoms. Systemic:  No fever, chills, sweats, or fatigue. ENT:  No nasal congestion, rhinorrhea, or sore throat. Pulmonary:  No cough, wheezing, shortness of breath, sputum production, hemoptysis. Cardiac:  No  palpitations, rapid heartbeat, dizziness, presyncope or syncope. GI:  No abdominal pain, heartburn, nausea, or vomiting. Ext:  No leg pain or swelling.  PMFSH:  Past medical history, family history, social history, meds, and allergies were reviewed and updated as needed.  Physical Exam:   Vital signs:  BP 144/93  Pulse 66  Temp(Src) 97.9 F (36.6 C) (Oral)  Resp 20  SpO2 100% Gen:  Alert, oriented, in no distress, skin warm and dry. Eye:  PERRL, lids and conjunctivas normal.  Sclera non-icteric. ENT:  Mucous membranes moist, pharynx clear. Neck:  Supple, no adenopathy or tenderness.  No JVD. Lungs:  Clear to auscultation, no wheezes, rales or rhonchi.  No respiratory distress. Heart:  Regular rhythm.  No gallops, murmers, clicks or rubs. Chest:  No chest wall tenderness. Abdomen:  Soft, nontender, no organomegaly or mass.  Bowel sounds normal.  No pulsatile abdominal mass or bruit. Ext:  No edema.  No calf tenderness and Homann's sign negative.  Pulses full and equal. Neurological examination: The patient is alert and oriented x3. Speech is clear, fluent, and appropriate. Cranial nerves are intact. There is no pronator drift and finger to nose was normal. Muscle strength, sensation, and DTRs are normal. Babinskis are downgoing. Station and gait were normal. Romberg sign is negative, patient is able to perform tandem gait well. Skin:  Warm and dry.  No rash.  EKG:   Date: 02/26/2013  Rate: 67  Rhythm: normal sinus rhythm  QRS Axis: normal  Intervals: normal  ST/T Wave abnormalities: normal  Conduction Disutrbances:none  Narrative Interpretation: Normal sinus rhythm, normal EKG.  Old EKG Reviewed: none available   Medications given in U[facility]:  She was started on IV normal saline at 50 mL per hour, given aspirin 325 mg by mouth and nitroglycerin 0.4 mg sublingually. She will be transported to the hospital emergency department via [facility].  Assessment:  The primary encounter diagnosis  was TIA (transient ischemic attack). A diagnosis of Angina pectoris was also pertinent to this visit.  Symptoms suggest transient ischemic attack and angina pectoris. She needs further evaluation.   Plan:   1.  The following meds were prescribed:   New Prescriptions   No medications on file   2.  The patient was transported to the emergency department via [facility] in stable condition.  Medical Decision Making:  52 year old female presents today with a 1 1/2 week history of intermittent numbness and weakness of left face and left arm.  Episodes last for hours.  Exam now is norma.  I am concerned about TIAs.  She also admits to a 1 month history of intermittent substernal chest pain without radiation.  No associated nausea, diaphoresis, or shortness of breath. No cardiac risk factors.  She states she has pain now, rated 3/10.  We will get an EKG, start IV NS, give TNG and ASA and transport by [facility].      Reuben Likes, MD 02/26/13 1235  Reuben Likes, MD 02/26/13 [phone]

## 2013-02-26 NOTE — ED Notes (Signed)
Pt reports numbness on left side of face that radiates to left arm and shoulder that comes and goes - symptoms for more than 10 days.

## 2013-02-26 NOTE — ED Notes (Signed)
Iv attempted once in right hand  - unsucessful. [facility] given report and care turned over - requested nitro to give to pt - given per prn order, pt complaint of pain 3/10.

## 2013-02-26 NOTE — ED Notes (Signed)
Notified [facility] oof patient needing transfer to ed

## 2013-02-26 NOTE — ED Notes (Signed)
Pt still in radiology.

## 2013-02-26 NOTE — ED Notes (Signed)
States she usually wears glasses but is not wearing them at this time

## 2013-02-26 NOTE — ED Provider Notes (Signed)
History     CSN: [id number]  Arrival date & time 02/26/13  1250   First MD Initiated Contact with Patient 02/26/13 1353      Chief Complaint  Patient presents with  . Chest Pain    (Consider location/radiation/quality/duration/timing/severity/associated sxs/prior treatment) HPI Comments: Patient is a 52 y/o F with PMHx of thyroidectomy performed in 2005 without complications on Synthroid presenting to the ED with weakness to left side of body x 2-3 months, chest pain x 1 month, and numbness to the left side x 1 week. Patient reported that weakness is mainly to the left upper arm, consisting of the left shoulder radiating down to the left elbow, with left sided facial weakness - jaw weakness, and left leg weakness that is intermittent. Stated that she is able to walk and function, but feels a heaviness. Reported that numbness x 1 week is mainly to the left side of the face, left arm from shoulder to elbow, and left leg - the severity varies with intermittent episodes. Patient stated that these episodes of numbness are associated with intermittent blurry vision to the left eye. Patient reported chest pain x 1 month that is mainly substernal, described as intermittent episodes of tightness and dullness with activity and at rest. Associated symptoms are decreased appetite, intermittent headaches mainly to the left frontal region. Denied fever, chills, facial drooping, slurred speech, head injury and trauma, changes to smell and taste, difficulty swallowing, shortness of breathe, difficulty swallowing, gi symptoms, urinary symptoms, travel, hiking, bug bite. Patient denied history of cardiac issues, hypertension, diabetes, coronary artery disease. Denied ever having episodes of this before.    Patient was seen at Urgent Care today and was transferred to ED.    Patient is a 52 y.o. female presenting with chest pain. The history is provided by the patient. No language interpreter was used.  Chest  Pain Associated symptoms: headache, numbness and weakness   Associated symptoms: no abdominal pain, no back pain, no dizziness, no fatigue, no fever, no nausea, no palpitations, no shortness of breath and not vomiting     History reviewed. No pertinent past medical history.  History reviewed. No pertinent past surgical history.  No family history on file.  History  Substance Use Topics  . Smoking status: Never Smoker   . Smokeless tobacco: Not on file  . Alcohol Use: No    OB History   Grav Para Term Preterm Abortions TAB SAB Ect Mult Living                  Review of Systems  Constitutional: Negative for fever, chills and fatigue.  HENT: Negative for ear pain, facial swelling, neck pain, neck stiffness and tinnitus.   Eyes: Positive for visual disturbance. Negative for pain and discharge.       Left eye blurry vision associated with intermittent episodes of numbness to left side  Respiratory: Negative for chest tightness, shortness of breath and wheezing.   Cardiovascular: Positive for chest pain. Negative for palpitations and leg swelling.  Gastrointestinal: Negative for nausea, vomiting, abdominal pain, diarrhea and constipation.  Genitourinary: Negative for dysuria, hematuria, decreased urine volume and difficulty urinating.  Musculoskeletal: Negative for back pain.  Skin: Negative for pallor and rash.  Neurological: Positive for weakness, numbness and headaches. Negative for dizziness, facial asymmetry, speech difficulty and light-headedness.       Numbness and weakness localized to left side  All other systems reviewed and are negative.    Allergies  Review of  patient's allergies indicates no known allergies.  Home Medications   Current Outpatient Rx  Name  Route  Sig  Dispense  Refill  . levothyroxine (SYNTHROID, LEVOTHROID) 125 MCG tablet   Oral   Take 125 mcg by mouth daily before breakfast.           BP 126/76  Pulse 58  Temp(Src) 98.3 F (36.8 C)  (Oral)  Resp 15  SpO2 99%  Physical Exam  Nursing note and vitals reviewed. Constitutional: She is oriented to person, place, and time. She appears well-developed and well-nourished. No distress.  HENT:  Head: Normocephalic and atraumatic.  Nose: Nose normal.  Mouth/Throat: Oropharynx is clear and moist. No oropharyngeal exudate.  Eyes: Conjunctivae and EOM are normal. Pupils are equal, round, and reactive to light. Right eye exhibits no discharge. Left eye exhibits no discharge.  Neck: Normal range of motion. Neck supple. No tracheal deviation present. No thyromegaly present.  Negative lymphadenopathy  Cardiovascular: Normal rate, regular rhythm, normal heart sounds and intact distal pulses.  Exam reveals no friction rub.   No murmur heard. Pulmonary/Chest: Effort normal and breath sounds normal. No respiratory distress. She has no wheezes. She has no rales. She exhibits no tenderness.  Abdominal: Soft. Bowel sounds are normal. She exhibits no distension. There is no tenderness. There is no rebound and no guarding.  Musculoskeletal: Normal range of motion. She exhibits no edema and no tenderness.  BUE: Strength 5+/5+. Full ROM. No erythema, inflammation, swelling, joint swelling noted. No sign of infection. Sensation intact for differentiation between soft and dull touch. Radial pulses 2+ bilaterally.   BLE: Strength 5+/5+. Full ROM. No erythema, inflammation, swelling, joint swelling noted. No sign of infection. Sensation intact for differentiation between soft and dull tough. Pedal pulses 2+ bilaterally.   Lymphadenopathy:    She has no cervical adenopathy.  Neurological: She is alert and oriented to person, place, and time. She has normal reflexes. She displays normal reflexes. No cranial nerve deficit. She exhibits normal muscle tone. Coordination normal.  Skin: Skin is warm and dry. No rash noted. She is not diaphoretic. No erythema.  Psychiatric: She has a normal mood and affect. Her  behavior is normal. Thought content normal.    ED Course  Procedures (including critical care time)  Labs Reviewed  CBC - Abnormal; Notable for the following:    HCT 35.3 (*)    All other components within normal limits  BASIC METABOLIC PANEL  POCT I-STAT TROPONIN I   Dg Chest 2 View  02/26/2013  *RADIOLOGY REPORT*  Clinical Data: Recurring chest pain for 2 months  CHEST - 2 VIEW  Comparison: 08/20/2004  Findings: Minimal enlargement of cardiac silhouette post median sternotomy. Stable slightly prominent mediastinum. Pulmonary vascularity normal. Lungs clear. No pleural effusion or pneumothorax. Surgical clips in the cervical region bilaterally question prior thyroidectomy. No acute osseous findings.  IMPRESSION: No new intrathoracic abnormalities. Minimally prominent mediastinum secondary to known mediastinal mass is unchanged.   Original Report Authenticated By: Ulyses Southward, M.D.     Date: 02/26/2013  Rate: 58  Rhythm: normal sinus rhythm  QRS Axis: normal  Intervals: normal  ST/T Wave abnormalities: nonspecific T wave changes  Conduction Disutrbances:none  Narrative Interpretation:   Old EKG Reviewed: unchanged    No diagnosis found.    MDM  Patient is afebrile, normotensive, non-tachycardic, alert and oriented. EKG negative for STEMI/STEMI - negative troponins. CBC and BMP negative findings. Chest xray no new intrathoracic abnormalities, minimal prominent mediastinum secondary to  known mediastinal mass is unchanged - has been noted since 2005. MRI of brain ordered to r/o mass affect or tumor. Discussed case with Wynetta Emery PA-C, transfer of care to [facility] at 4:18pm.         Raymon Mutton, PA-C 02/26/13 1703

## 2013-02-26 NOTE — ED Notes (Signed)
Pt sts she has been having intermittent numbness to left face and hand for weeks. Pt is unsure if the numbness on face comes at the same time as her hand. Pt also sts she has been having cp for 10 days. Pt sts after receiving medications she no longer has CP. Pt in nad, ambulatory, skin warm and dry, resp e/u.

## 2013-02-26 NOTE — ED Notes (Signed)
Pt reports numbness to left side of face for the past week and a half, sometimes hurts to chew, but continues to have full range of motion to neck and arm on left side.

## 2013-02-26 NOTE — ED Provider Notes (Signed)
Yesenia Salas is a 52 y.o. female complaining of Left sided weakness, in  Jaw and LUEx 3 months, CP x1 month dull achy described as intermittent, no SOB. Numbness x1 week left sided. 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 Plan is to followup MRI and if it is negative discharge her home with PCP and neuro referral  PCP [place]  MRI shows no acute abnormalities. Chest x-ray does show mediastinal mass with no interval changes.  Vital signs stable, patient is amenable and appropriate discharge at this time. Patient discharge with neurology and cardiology referral.  Wynetta Emery, PA-C 03/01/13 [phone]

## 2013-02-28 NOTE — ED Provider Notes (Signed)
Medical screening examination/treatment/procedure(s) were performed by non-physician practitioner and as supervising physician I was immediately available for consultation/collaboration.   Richardean Canal, MD 02/28/13 [phone]

## 2013-03-01 NOTE — ED Provider Notes (Signed)
Medical screening examination/treatment/procedure(s) were performed by non-physician practitioner and as supervising physician I was immediately available for consultation/collaboration.   Anaelle Dunton, MD 03/01/13 0812 

## 2013-03-12 ENCOUNTER — Ambulatory Visit (INDEPENDENT_AMBULATORY_CARE_PROVIDER_SITE_OTHER): Payer: Medicaid Other | Admitting: Diagnostic Neuroimaging

## 2013-03-12 ENCOUNTER — Encounter: Payer: Self-pay | Admitting: Diagnostic Neuroimaging

## 2013-03-12 VITALS — BP 120/78 | HR 71 | Temp 98.4°F | Ht 64.0 in | Wt 181.0 lb

## 2013-03-12 DIAGNOSIS — G459 Transient cerebral ischemic attack, unspecified: Secondary | ICD-10-CM

## 2013-03-12 DIAGNOSIS — R209 Unspecified disturbances of skin sensation: Secondary | ICD-10-CM

## 2013-03-12 DIAGNOSIS — R200 Anesthesia of skin: Secondary | ICD-10-CM

## 2013-03-12 NOTE — Patient Instructions (Addendum)
Follow up with cardiology and PCP.  I will check MRA head/neck.  Continue Aspirin 81mg  daily.

## 2013-03-12 NOTE — Progress Notes (Signed)
[facility]  PATIENT: Yesenia Salas DOB: 1960/11/18  REFERRING CLINICIAN: ED followup HISTORY FROM: Patient REASON FOR VISIT: TIA, Numbness in left face and arm   HISTORICAL  CHIEF COMPLAINT:  Chief Complaint  Patient presents with  . Numbness    numbness on left side of face    HISTORY OF PRESENT ILLNESS: 52 year old female with h/o post-surgical hypothyroidism, here with numbness and tingling in the left side of her face that would come and go for about a week and a half before going to the ER on 02/26/13.  She reports drooping on the left side of her face as well.  She states the numbness and tingling occurs daily and goes on for hours, with no precipitating factors.  She reports some numbness and tingling in her left arm as well, starting at her neck and going to her elbow.  She also states she has some weakness and numbness in the left leg that has been going on for over a year.  She has had intermittent chest pain that she also had the day she went to the hospital which was lessened with 2 tablets of nitroglycerin.  She has seen Dr. Orpah Cobb and plans to have a stress test tomorrow.  She denies trouble with speaking or swallowing.  Denies balance, gait or coordination problems.   REVIEW OF SYSTEMS: Full 14 system review of systems performed and negative.  ALLERGIES: No Known Allergies  HOME MEDICATIONS: Outpatient Prescriptions Prior to Visit  Medication Sig Dispense Refill  .    levothyroxine (SYNTHROID, LEVOTHROID) 125 MCG tablet   Take 125 mcg by mouth daily before breakfast.        No facility-administered medications prior to visit.    PAST MEDICAL HISTORY: No past medical history on file.  PAST SURGICAL HISTORY: Past Surgical History  Procedure Laterality Date  . Thyroidectomy  02-2004    FAMILY HISTORY: Family History  Problem Relation Age of Onset  . Hypertension Mother     SOCIAL HISTORY:  History   Social History  .  Marital Status: Married    Spouse Name: N/A    Number of Children: 2  . Years of Education: N/A   Occupational History  . unemployed    Social History Main Topics  . Smoking status: Never Smoker   . Smokeless tobacco: Not on file  . Alcohol Use: No     Comment: Patient drinks 1-2 cups of coffee a day.  . Drug Use: No  . Sexually Active: Not on file   Other Topics Concern  . Not on file   Social History Narrative  . No narrative on file     PHYSICAL EXAM  Filed Vitals:   03/12/13 0946  BP: 120/78  Pulse: 71  Temp: 98.4 F (36.9 C)  TempSrc: Oral  Height: 5\' 4"  (1.626 m)  Weight: 181 lb (82.101 kg)   Body mass index is 31.05 kg/(m^2).  GENERAL EXAM: Patient is in no distress  CARDIOVASCULAR: Regular rate and rhythm, no murmurs, no carotid bruits  NEUROLOGIC: MENTAL STATUS: awake, alert, language fluent, comprehension intact, naming intact CRANIAL NERVE: no papilledema on fundoscopic exam, pupils equal and reactive to light, visual fields full to confrontation, extraocular muscles intact, no nystagmus, facial sensation DECREASED ON THE LEFT TO PP AND PRESSURE, uvula midline, shoulder shrug symmetric, tongue midline. MOTOR: normal bulk and tone, full strength in the BUE, BLE SENSORY: normal and symmetric to light touch, pinprick, temperature, vibration  COORDINATION: finger-nose-finger, fine finger movements normal REFLEXES: deep tendon reflexes present, triceps and bilat biceps 1, L PATELLAR TRACE, R PATELLAR 1, L ACHILLES ABSENT, R ACHILLES TRACE,  GAIT/STATION: narrow based gait; able to walk on toes, heels and tandem; romberg is negative    DIAGNOSTIC DATA (LABS, IMAGING, TESTING) - I reviewed patient records, labs, notes, testing and imaging myself where available.  Lab Results  Component Value Date   WBC 5.1 02/26/2013   HGB 12.1 02/26/2013   HCT 35.3* 02/26/2013   MCV 88.5 02/26/2013   PLT 234 02/26/2013      Component Value Date/Time   NA 140  02/26/2013 1434   K 3.6 02/26/2013 1434   CL 103 02/26/2013 1434   CO2 26 02/26/2013 1434   GLUCOSE 91 02/26/2013 1434   BUN 7 02/26/2013 1434   CREATININE 0.61 02/26/2013 1434   CALCIUM 9.5 02/26/2013 1434   GFRNONAA >90 02/26/2013 1434   GFRAA >90 02/26/2013 1434   No results found for this basename: CHOL, HDL, LDLCALC, LDLDIRECT, TRIG, CHOLHDL   No results found for this basename: HGBA1C   No results found for this basename: VITAMINB12   Lab Results  Component Value Date   TSH 0.04* 02/22/2008   02/26/13 MRI brain - normal   ASSESSMENT AND PLAN  52 y.o. year old female here with left sided face, arm, leg numbness/tingling.   Localization: right brain subcortical, right midbrain, right upper pons  Ddx: MRI negative stroke vs. TIA, seizure/migraine less likely   PLAN: 1. MRA head, neck 2. Ask PCP if lipids, A1c, TSH, B12 checked recently; if not, will request that they order 3. Ask cardiology to check echocardiogram for cardiac source of emboli 4. Continue aspirin   Demaris Leavell NP-C 03/12/2013, 11:06 AM  [facility] [address] [place], [place] [address] [phone]   I examined patient, reviewed note and agree with plan.  Will proceed with TIA/stroke workup.   Suanne Marker, MD 03/12/2013, 4:45 PM Certified in Neurology, Neurophysiology and Neuroimaging  [facility] [address], [address] [place], [place] [address] [phone]

## 2013-03-28 ENCOUNTER — Ambulatory Visit
Admission: RE | Admit: 2013-03-28 | Discharge: 2013-03-28 | Disposition: A | Discharge status: Home / Self Care | Payer: Medicaid Other | Source: Ambulatory Visit | Attending: Diagnostic Neuroimaging | Admitting: Diagnostic Neuroimaging

## 2013-03-28 DIAGNOSIS — G459 Transient cerebral ischemic attack, unspecified: Secondary | ICD-10-CM

## 2013-03-28 DIAGNOSIS — R200 Anesthesia of skin: Secondary | ICD-10-CM

## 2013-03-28 MED ORDER — GADOBENATE DIMEGLUMINE 529 MG/ML IV SOLN
20.0000 mL | Freq: Once | INTRAVENOUS | Status: AC | PRN
  Administered 2013-03-28: 20 mL via INTRAVENOUS

## 2013-06-28 ENCOUNTER — Ambulatory Visit
Admission: RE | Admit: 2013-06-28 | Discharge: 2013-06-28 | Disposition: A | Discharge status: Home / Self Care | Payer: Medicaid Other | Source: Ambulatory Visit | Attending: Cardiovascular Disease | Admitting: Cardiovascular Disease

## 2013-06-28 ENCOUNTER — Other Ambulatory Visit: Payer: Self-pay | Admitting: Cardiovascular Disease

## 2013-06-28 DIAGNOSIS — R29898 Other symptoms and signs involving the musculoskeletal system: Secondary | ICD-10-CM

## 2013-09-20 ENCOUNTER — Other Ambulatory Visit: Payer: Self-pay

## 2014-08-20 ENCOUNTER — Other Ambulatory Visit: Payer: Self-pay

## 2014-08-20 DIAGNOSIS — R234 Changes in skin texture: Secondary | ICD-10-CM

## 2014-08-30 ENCOUNTER — Other Ambulatory Visit: Payer: Self-pay

## 2014-09-02 ENCOUNTER — Other Ambulatory Visit: Payer: Self-pay

## 2014-09-02 ENCOUNTER — Other Ambulatory Visit: Payer: Self-pay | Admitting: Internal Medicine

## 2014-09-02 DIAGNOSIS — N644 Mastodynia: Secondary | ICD-10-CM

## 2014-09-03 ENCOUNTER — Other Ambulatory Visit: Payer: Self-pay

## 2014-09-04 LAB — CYTOLOGY - PAP

## 2014-09-10 ENCOUNTER — Ambulatory Visit
Admission: RE | Admit: 2014-09-10 | Discharge: 2014-09-10 | Disposition: A | Discharge status: Home / Self Care | Payer: Medicaid Other | Source: Ambulatory Visit | Attending: Internal Medicine | Admitting: Internal Medicine

## 2014-09-10 ENCOUNTER — Ambulatory Visit
Admission: RE | Admit: 2014-09-10 | Discharge: 2014-09-10 | Disposition: A | Discharge status: Home / Self Care | Payer: Medicaid Other | Source: Ambulatory Visit

## 2014-09-10 ENCOUNTER — Other Ambulatory Visit: Payer: Self-pay | Admitting: Internal Medicine

## 2014-09-10 DIAGNOSIS — N644 Mastodynia: Secondary | ICD-10-CM

## 2015-10-28 ENCOUNTER — Other Ambulatory Visit: Payer: Self-pay

## 2015-10-28 DIAGNOSIS — Z1231 Encounter for screening mammogram for malignant neoplasm of breast: Secondary | ICD-10-CM

## 2015-11-19 ENCOUNTER — Ambulatory Visit
Admission: RE | Admit: 2015-11-19 | Discharge: 2015-11-19 | Disposition: A | Discharge status: Home / Self Care | Payer: Medicaid Other | Source: Ambulatory Visit

## 2015-11-19 DIAGNOSIS — Z1231 Encounter for screening mammogram for malignant neoplasm of breast: Secondary | ICD-10-CM

## 2015-11-20 ENCOUNTER — Other Ambulatory Visit: Payer: Self-pay | Admitting: Internal Medicine

## 2015-11-20 DIAGNOSIS — R928 Other abnormal and inconclusive findings on diagnostic imaging of breast: Secondary | ICD-10-CM

## 2015-11-25 ENCOUNTER — Other Ambulatory Visit: Payer: Medicaid Other

## 2015-11-27 ENCOUNTER — Other Ambulatory Visit: Payer: Medicaid Other

## 2015-11-28 ENCOUNTER — Ambulatory Visit
Admission: RE | Admit: 2015-11-28 | Discharge: 2015-11-28 | Disposition: A | Discharge status: Home / Self Care | Payer: Medicaid Other | Source: Ambulatory Visit | Attending: Internal Medicine | Admitting: Internal Medicine

## 2015-11-28 DIAGNOSIS — R928 Other abnormal and inconclusive findings on diagnostic imaging of breast: Secondary | ICD-10-CM

## 2016-03-08 ENCOUNTER — Encounter (HOSPITAL_COMMUNITY): Payer: Self-pay | Admitting: Emergency Medicine

## 2016-03-08 ENCOUNTER — Ambulatory Visit (HOSPITAL_COMMUNITY)
Admission: EM | Admit: 2016-03-08 | Discharge: 2016-03-08 | Disposition: A | Discharge status: Home / Self Care | Payer: Medicaid Other | Attending: Family Medicine | Admitting: Family Medicine

## 2016-03-08 DIAGNOSIS — H1012 Acute atopic conjunctivitis, left eye: Secondary | ICD-10-CM

## 2016-03-08 DIAGNOSIS — M778 Other enthesopathies, not elsewhere classified: Secondary | ICD-10-CM | POA: Diagnosis not present

## 2016-03-08 MED ORDER — OLOPATADINE HCL 0.1 % OP SOLN: 1.0000 [drp] | Freq: Two times a day (BID) | OPHTHALMIC | Status: DC

## 2016-03-08 MED ORDER — DICLOFENAC SODIUM 1 % TD GEL: 2.0000 g | Freq: Four times a day (QID) | TRANSDERMAL | Status: DC

## 2016-03-08 NOTE — ED Provider Notes (Signed)
CSN: [id number]     Arrival date & time 03/08/16  1400 History   First MD Initiated Contact with Patient 03/08/16 1408     Chief Complaint  Patient presents with  . Wrist Pain   (Consider location/radiation/quality/duration/timing/severity/associated sxs/prior Treatment) Patient is a 55 y.o. female presenting with wrist pain. The history is provided by the patient.  Wrist Pain This is a new problem. The current episode started more than 1 week ago. The problem has been gradually worsening. Associated symptoms comments: Eye redness for sev days.    Past Medical History  Diagnosis Date  . Hypothyroidism, postsurgical    Past Surgical History  Procedure Laterality Date  . Thyroidectomy  02-2004   Family History  Problem Relation Age of Onset  . Hypertension Mother    Social History  Substance Use Topics  . Smoking status: Never Smoker   . Smokeless tobacco: None  . Alcohol Use: No     Comment: Patient drinks 1-2 cups of coffee a day.   OB History    No data available     Review of Systems  Constitutional: Negative.   HENT: Negative.   Eyes: Positive for redness and itching. Negative for photophobia and visual disturbance.  Musculoskeletal: Positive for myalgias and joint swelling.  Skin: Negative.  Negative for rash and wound.  All other systems reviewed and are negative.   Allergies  Review of patient's allergies indicates no known allergies.  Home Medications   Prior to Admission medications   Medication Sig Start Date End Date Taking? Authorizing Provider  aspirin 81 MG EC tablet Take 81 mg by mouth daily. Swallow whole.    Historical Provider, MD  levothyroxine (SYNTHROID, LEVOTHROID) 125 MCG tablet Take 125 mcg by mouth daily before breakfast.    Historical Provider, MD   Meds Ordered and Administered this Visit  Medications - No data to display  BP 127/81 mmHg  Pulse 77  Temp(Src) 98.6 F (37 C) (Oral)  Resp 16  SpO2 100% No data found.   Physical  Exam  Constitutional: She is oriented to person, place, and time. She appears well-developed and well-nourished.  Eyes: EOM are normal. Pupils are equal, round, and reactive to light. Right eye exhibits discharge. Left eye exhibits discharge. Left eye exhibits no exudate. Right conjunctiva is not injected. Left conjunctiva is injected.  Musculoskeletal: She exhibits tenderness.       Right wrist: She exhibits tenderness, swelling and crepitus. She exhibits no deformity.       Arms: Neurological: She is alert and oriented to person, place, and time.  Skin: Skin is warm and dry.  Nursing note and vitals reviewed.   ED Course  Procedures (including critical care time)  Labs Review Labs Reviewed - No data to display  Imaging Review No results found.   Visual Acuity Review  Right Eye Distance:   Left Eye Distance:   Bilateral Distance:    Right Eye Near:   Left Eye Near:    Bilateral Near:         MDM  No diagnosis found. Meds ordered this encounter  Medications  . diclofenac sodium (VOLTAREN) 1 % GEL    Sig: Apply 2 g topically 4 (four) times daily. To wrist    Dispense:  1 Tube    Refill:  2  . olopatadine (PATANOL) 0.1 % ophthalmic solution    Sig: Place 1 drop into the left eye 2 (two) times daily.    Dispense:  5 mL  Refill:  1       Linna Hoff, MD 03/08/16 [phone]

## 2016-03-08 NOTE — Discharge Instructions (Signed)
Use ice , splint and medicine as prescribed., eye drops as needed. Observe for aggravating activity.

## 2016-03-08 NOTE — ED Notes (Addendum)
Right wrist pain for 2 weeks, no known injury.  Patient is right handed. Radial pulse 2 plus in right wrist.  Brisk cap refill, no numbness or tingling of right fingers Both eyes itching

## 2016-04-07 ENCOUNTER — Other Ambulatory Visit: Payer: Self-pay | Admitting: Gastroenterology

## 2017-01-28 ENCOUNTER — Other Ambulatory Visit: Payer: Self-pay | Admitting: Internal Medicine

## 2017-01-28 DIAGNOSIS — E2839 Other primary ovarian failure: Secondary | ICD-10-CM

## 2018-04-03 ENCOUNTER — Other Ambulatory Visit: Payer: Self-pay | Admitting: Internal Medicine

## 2018-04-03 DIAGNOSIS — N63 Unspecified lump in unspecified breast: Secondary | ICD-10-CM

## 2018-04-12 ENCOUNTER — Other Ambulatory Visit: Payer: Self-pay | Admitting: Internal Medicine

## 2018-04-12 ENCOUNTER — Other Ambulatory Visit: Payer: Self-pay

## 2018-04-12 DIAGNOSIS — N63 Unspecified lump in unspecified breast: Secondary | ICD-10-CM

## 2018-04-14 ENCOUNTER — Other Ambulatory Visit: Payer: Self-pay

## 2018-04-18 ENCOUNTER — Ambulatory Visit
Admission: RE | Admit: 2018-04-18 | Discharge: 2018-04-18 | Disposition: A | Discharge status: Home / Self Care | Payer: Medicaid Other | Source: Ambulatory Visit | Attending: Internal Medicine | Admitting: Internal Medicine

## 2018-04-18 DIAGNOSIS — N63 Unspecified lump in unspecified breast: Secondary | ICD-10-CM

## 2018-04-19 ENCOUNTER — Other Ambulatory Visit: Payer: Self-pay

## 2018-05-19 ENCOUNTER — Ambulatory Visit (HOSPITAL_COMMUNITY)
Admission: EM | Admit: 2018-05-19 | Discharge: 2018-05-19 | Disposition: A | Discharge status: Home / Self Care | Payer: Medicaid Other | Attending: Family Medicine | Admitting: Family Medicine

## 2018-05-19 ENCOUNTER — Other Ambulatory Visit: Payer: Self-pay

## 2018-05-19 ENCOUNTER — Encounter (HOSPITAL_COMMUNITY): Payer: Self-pay | Admitting: Emergency Medicine

## 2018-05-19 DIAGNOSIS — H9201 Otalgia, right ear: Secondary | ICD-10-CM

## 2018-05-19 DIAGNOSIS — H6591 Unspecified nonsuppurative otitis media, right ear: Secondary | ICD-10-CM

## 2018-05-19 MED ORDER — PREDNISONE 50 MG PO TABS: 50.0000 mg | ORAL_TABLET | Freq: Every day | ORAL | 0 refills | Status: AC

## 2018-05-19 MED ORDER — IPRATROPIUM BROMIDE 0.06 % NA SOLN
2.0000 | Freq: Four times a day (QID) | NASAL | 0 refills | Status: DC
Start: 2018-05-19 — End: 2019-05-17

## 2018-05-19 MED ORDER — FLUTICASONE PROPIONATE 50 MCG/ACT NA SUSP: 2.0000 | Freq: Every day | NASAL | 0 refills | Status: DC

## 2018-05-19 MED ORDER — AMOXICILLIN-POT CLAVULANATE 875-125 MG PO TABS: 1.0000 | ORAL_TABLET | Freq: Two times a day (BID) | ORAL | 0 refills | Status: DC

## 2018-05-19 NOTE — ED Provider Notes (Signed)
[facility]    CSN: [id number] Arrival date & time: 05/19/18  1710     History   Chief Complaint Chief Complaint  Patient presents with  . Otalgia  . Torticollis    HPI Yesenia Salas is a 57 y.o. female.   57 year old female comes in for 5-day history of right-sided ear pain and neck pain.  States that right ear pain is worse with swallowing.  Denies rhinorrhea, nasal congestion.  She has cough with subjective fever and chills.  Has intermittent frontal pressure.  Denies nausea, vomiting, photophobia.  Denies recent travels, swimming.  No cotton swab use.  Advil with some relief.     Past Medical History:  Diagnosis Date  . Hypothyroidism, postsurgical     Patient Active Problem List   Diagnosis Date Noted  . Unspecified hypothyroidism 10/17/2007    Past Surgical History:  Procedure Laterality Date  . THYROIDECTOMY  02-2004    OB History   None      Home Medications    Prior to Admission medications   Medication Sig Start Date End Date Taking? Authorizing Provider  aspirin 81 MG EC tablet Take 81 mg by mouth daily. Swallow whole.   Yes [provider]  levothyroxine (SYNTHROID, LEVOTHROID) 125 MCG tablet Take 125 mcg by mouth daily before breakfast.   Yes [provider]  amoxicillin-clavulanate (AUGMENTIN) 875-125 MG tablet Take 1 tablet by mouth every 12 (twelve) hours. 05/19/18   Cathie Hoops, Kaitland Lewellyn V, PA-C  fluticasone (FLONASE) 50 MCG/ACT nasal spray Place 2 sprays into both nostrils daily. 05/19/18   Cathie Hoops, Delayni Streed V, PA-C  ipratropium (ATROVENT) 0.06 % nasal spray Place 2 sprays into both nostrils 4 (four) times daily. 05/19/18   Cathie Hoops, Edlyn Rosenburg V, PA-C  predniSONE (DELTASONE) 50 MG tablet Take 1 tablet (50 mg total) by mouth daily for 5 days. 05/19/18 05/24/18  Belinda Fisher, PA-C    Family History Family History  Problem Relation Age of Onset  . Hypertension Mother     Social History Social History   Tobacco Use  . Smoking status: Never Smoker    Substance Use Topics  . Alcohol use: No    Comment: Patient drinks 1-2 cups of coffee a day.  . Drug use: No     Allergies   Patient has no known allergies.   Review of Systems Review of Systems  Reason unable to perform ROS: See HPI as above.     Physical Exam Triage Vital Signs ED Triage Vitals  Enc Vitals Group     BP 05/19/18 1812 (!) 148/84     Pulse Rate 05/19/18 1812 66     Resp 05/19/18 1812 18     Temp 05/19/18 1812 97.8 F (36.6 C)     Temp Source 05/19/18 1812 Oral     SpO2 05/19/18 1812 100 %     Weight --      Height --      Head Circumference --      Peak Flow --      Pain Score 05/19/18 1811 6     Pain Loc --      Pain Edu? --      Excl. in GC? --    No data found.  Updated Vital Signs BP (!) 148/84 (BP Location: Left Arm)   Pulse 66   Temp 97.8 F (36.6 C) (Oral)   Resp 18   SpO2 100%   Physical Exam  Constitutional: She is oriented to  person, place, and time. She appears well-developed and well-nourished. No distress.  HENT:  Head: Normocephalic and atraumatic.  Right Ear: External ear and ear canal normal. Tympanic membrane is not erythematous and not bulging. A middle ear effusion is present.  Left Ear: Tympanic membrane, external ear and ear canal normal. Tympanic membrane is not erythematous and not bulging.  Nose: Right sinus exhibits maxillary sinus tenderness and frontal sinus tenderness. Left sinus exhibits maxillary sinus tenderness and frontal sinus tenderness.  Mouth/Throat: Uvula is midline, oropharynx is clear and moist and mucous membranes are normal.  Patient with tenderness to palpation of right tragus.  Ear canal without erythema or swelling.  Eyes: Pupils are equal, round, and reactive to light. Conjunctivae are normal.  Neck: Normal range of motion. Neck supple.  Cardiovascular: Normal rate, regular rhythm and normal heart sounds. Exam reveals no gallop and no friction rub.  No murmur heard. Pulmonary/Chest: Effort  normal and breath sounds normal. She has no decreased breath sounds. She has no wheezes. She has no rhonchi. She has no rales.  Lymphadenopathy:       Head (right side): Submandibular adenopathy present.  Neurological: She is alert and oriented to person, place, and time.  Skin: Skin is warm and dry.  Psychiatric: She has a normal mood and affect. Her behavior is normal. Judgment normal.     UC Treatments / Results  Labs (all labs ordered are listed, but only abnormal results are displayed) Labs Reviewed - No data to display  EKG None  Radiology No results found.  Procedures Procedures (including critical care time)  Medications Ordered in UC Medications - No data to display  Initial Impression / Assessment and Plan / UC Course  I have reviewed the triage vital signs and the nursing notes.  Pertinent labs & imaging results that were available during my care of the patient were reviewed by me and considered in my medical decision making (see chart for details).    Will treat for eustachian tube dysfunction, sinus pressure with prednisone, Flonase, Atrovent nasal spray.  Other symptomatic treatment discussed.  Rx of Augmentin provided, patient to fill in 5 days if symptoms not improving for sinusitis.  Return precautions given.  Patient expresses understanding and agrees to plan.  Final Clinical Impressions(s) / UC Diagnoses   Final diagnoses:  Right ear pain  Fluid level behind tympanic membrane of right ear    ED Prescriptions    Medication Sig Dispense Auth. Provider   predniSONE (DELTASONE) 50 MG tablet Take 1 tablet (50 mg total) by mouth daily for 5 days. 5 tablet Marua Qin V, PA-C   fluticasone (FLONASE) 50 MCG/ACT nasal spray Place 2 sprays into both nostrils daily. 1 g Braxtyn Bojarski V, PA-C   ipratropium (ATROVENT) 0.06 % nasal spray Place 2 sprays into both nostrils 4 (four) times daily. 15 mL Lasharn Bufkin V, PA-C   amoxicillin-clavulanate (AUGMENTIN) 875-125 MG tablet Take 1  tablet by mouth every 12 (twelve) hours. 14 tablet Threasa AlphaYu, Sherlock Nancarrow V, PA-C        Endrit Gittins V, [place]PA-C 05/19/18 1950

## 2018-05-19 NOTE — ED Triage Notes (Signed)
[facility] a [facility] right side otalgia and neck pain x 5 days.

## 2018-05-19 NOTE — Discharge Instructions (Signed)
Prednisone as directed. Start flonase, atrovent nasal spray for sinus pressure and ear pain. You can use over the counter nasal saline rinse such as neti pot for nasal congestion. Keep hydrated, your urine should be clear to pale yellow in color. Tylenol/motrin for fever and pain. Monitor for any worsening of symptoms, chest pain, shortness of breath, wheezing, swelling of the throat, follow up for reevaluation.   If symptoms not improving in 5 days, can fill augmentin for possible sinus infection.  For sore throat/cough try using a honey-based tea. Use 3 teaspoons of honey with juice squeezed from half lemon. Place shaved pieces of ginger into 1/2-1 cup of water and warm over stove top. Then mix the ingredients and repeat every 4 hours as needed.

## 2018-07-26 ENCOUNTER — Other Ambulatory Visit: Payer: Self-pay | Admitting: Internal Medicine

## 2018-07-26 DIAGNOSIS — E2839 Other primary ovarian failure: Secondary | ICD-10-CM

## 2018-08-10 ENCOUNTER — Ambulatory Visit: Payer: Medicaid Other | Admitting: Certified Nurse Midwife

## 2018-08-10 ENCOUNTER — Other Ambulatory Visit: Payer: Self-pay

## 2018-08-10 ENCOUNTER — Other Ambulatory Visit (HOSPITAL_COMMUNITY)
Admission: RE | Admit: 2018-08-10 | Discharge: 2018-08-10 | Disposition: A | Discharge status: Home / Self Care | Payer: Medicaid Other | Source: Ambulatory Visit | Attending: Certified Nurse Midwife | Admitting: Certified Nurse Midwife

## 2018-08-10 ENCOUNTER — Encounter: Payer: Self-pay | Admitting: Certified Nurse Midwife

## 2018-08-10 VITALS — BP 143/92 | HR 71 | Ht 64.0 in | Wt 180.7 lb

## 2018-08-10 DIAGNOSIS — Z0000 Encounter for general adult medical examination without abnormal findings: Secondary | ICD-10-CM

## 2018-08-10 DIAGNOSIS — Z01419 Encounter for gynecological examination (general) (routine) without abnormal findings: Secondary | ICD-10-CM | POA: Insufficient documentation

## 2018-08-10 NOTE — Progress Notes (Signed)
New GYN presents for AEX/PAP.  Reports no problems today. BP elevated, PCP is [facility].  Last Mammogram 04/18/2018.

## 2018-08-10 NOTE — Progress Notes (Signed)
GYNECOLOGY ANNUAL PREVENTATIVE CARE ENCOUNTER NOTE  Subjective:   Yesenia Salas is a 57 y.o. G24P2012 female here for a routine annual gynecologic exam.  Current complaints: none.   Denies abnormal vaginal bleeding, discharge, pelvic pain, problems with intercourse or other gynecologic concerns.    Gynecologic History No LMP recorded. Patient is postmenopausal. Last Pap: 2015. Results were: abnormal with positive HPV Last mammogram: 04/2018. Results were: normal- multiple benign cyst present, repeat mammo in 1 year   Obstetric History OB History  Gravida Para Term Preterm AB Living  3 2 2   1 2   SAB TAB Ectopic Multiple Live Births               # Outcome Date GA Lbr Len/2nd Weight Sex Delivery Anes PTL Lv  3 Term           2 Term           1 AB             Past Medical History:  Diagnosis Date  . Hypothyroidism, postsurgical     Past Surgical History:  Procedure Laterality Date  . THYROIDECTOMY  02-2004    Current Outpatient Medications on File Prior to Visit  Medication Sig Dispense Refill  . atorvastatin (LIPITOR) 40 MG tablet TK 1 T PO Q EVENING  5  . levothyroxine (SYNTHROID, LEVOTHROID) 125 MCG tablet Take 125 mcg by mouth daily before breakfast.    . amoxicillin-clavulanate (AUGMENTIN) 875-125 MG tablet Take 1 tablet by mouth every 12 (twelve) hours. (Patient not taking: Reported on 08/10/2018) 14 tablet 0  . aspirin 81 MG EC tablet Take 81 mg by mouth daily. Swallow whole.    . fluticasone (FLONASE) 50 MCG/ACT nasal spray Place 2 sprays into both nostrils daily. (Patient not taking: Reported on 08/10/2018) 1 g 0  . ipratropium (ATROVENT) 0.06 % nasal spray Place 2 sprays into both nostrils 4 (four) times daily. (Patient not taking: Reported on 08/10/2018) 15 mL 0   No current facility-administered medications on file prior to visit.     No Known Allergies  Social History:  reports that she has never smoked. She has never used smokeless tobacco. She reports that  she does not drink alcohol or use drugs.  Family History  Problem Relation Age of Onset  . Hypertension Mother     The following portions of the patient's history were reviewed and updated as appropriate: allergies, current medications, past family history, past medical history, past social history, past surgical history and problem list.  Review of Systems Pertinent items noted in HPI and remainder of comprehensive ROS otherwise negative.   Objective:  BP (!) 143/92 (BP Location: Left Arm, Cuff Size: Normal)   Pulse 71   Ht 5\' 4"  (1.626 m)   Wt 180 lb 11.2 oz (82 kg)   BMI 31.02 kg/m  CONSTITUTIONAL: Well-developed, well-nourished female in no acute distress.  HENT:  Normocephalic, atraumatic, External right and left ear normal. Oropharynx is clear and moist EYES: Conjunctivae and EOM are normal. Pupils are equal, round, and reactive to light. No scleral icterus.  NECK: Normal range of motion, supple, no masses.  Thyroidectomy.  SKIN: Skin is warm and dry. No rash noted. Not diaphoretic. No erythema. No pallor. MUSCULOSKELETAL: Normal range of motion. No tenderness.  No cyanosis, clubbing, or edema.  2+ distal pulses. NEUROLOGIC: Alert and oriented to person, place, and time. Normal reflexes, muscle tone coordination. No cranial nerve deficit noted. PSYCHIATRIC: Normal  mood and affect. Normal behavior. Normal judgment and thought content. CARDIOVASCULAR: Normal heart rate noted, regular rhythm RESPIRATORY: Clear to auscultation bilaterally. Effort and breath sounds normal, no problems with respiration noted. BREASTS: Symmetric in size. No palpable masses, skin changes,or nipple drainage ABDOMEN: Soft, normal bowel sounds, no distention noted.  No tenderness, rebound or guarding.  PELVIC: Normal appearing external genitalia; normal appearing vaginal mucosa. Cervical polyp present at 12 oclock.  No abnormal discharge noted.  Pap smear obtained.  Normal uterine size, no other palpable  masses, no uterine or adnexal tenderness.   Assessment and Plan:  1. Encounter for annual routine gynecological examination - Normal well woman examination  - Hypothyroidism (post thyroidectomy) is managed by PCP along with Hypertension  - Cytology - PAP  Will follow up results of pap smear and manage accordingly. Routine preventative health maintenance measures emphasized. Please refer to After Visit Summary for other counseling recommendations.   Sharyon Cable, CNM 08/10/18, 12:51 PM

## 2018-08-15 LAB — CYTOLOGY - PAP
Bacterial vaginitis: NEGATIVE
Candida vaginitis: POSITIVE — AB
Chlamydia: NEGATIVE
Diagnosis: UNDETERMINED — AB
HPV: DETECTED — AB
Neisseria Gonorrhea: NEGATIVE
Trichomonas: NEGATIVE

## 2018-08-22 ENCOUNTER — Encounter: Payer: Medicaid Other | Admitting: Obstetrics and Gynecology

## 2018-09-06 ENCOUNTER — Encounter: Payer: Self-pay | Admitting: Obstetrics & Gynecology

## 2018-09-06 ENCOUNTER — Ambulatory Visit: Payer: Medicaid Other | Admitting: Obstetrics & Gynecology

## 2018-09-06 ENCOUNTER — Other Ambulatory Visit (HOSPITAL_COMMUNITY)
Admission: RE | Admit: 2018-09-06 | Discharge: 2018-09-06 | Disposition: A | Discharge status: Home / Self Care | Payer: Medicaid Other | Source: Ambulatory Visit | Attending: Obstetrics & Gynecology | Admitting: Obstetrics & Gynecology

## 2018-09-06 VITALS — BP 142/84 | HR 71 | Wt 182.5 lb

## 2018-09-06 DIAGNOSIS — R87610 Atypical squamous cells of undetermined significance on cytologic smear of cervix (ASC-US): Secondary | ICD-10-CM

## 2018-09-06 DIAGNOSIS — R87810 Cervical high risk human papillomavirus (HPV) DNA test positive: Secondary | ICD-10-CM

## 2018-09-06 NOTE — Progress Notes (Signed)
Patient ID: Yesenia Salas, female   DOB: Nov 06, 1961, 57 y.o.   MRN: [id number]  Chief Complaint  Patient presents with  . Colposcopy    HPI. Yesenia Salas is a 57 y.o. female.  [id number] No LMP recorded. Patient is postmenopausal. Pap last month was abnormal  HPI  Indications: Pap smear on September 2019 showed: ASCUS with POSITIVE high risk HPV. Previous colposcopy: no. Prior cervical treatment: no treatment.  Past Medical History:  Diagnosis Date  . Hypothyroidism, postsurgical     Past Surgical History:  Procedure Laterality Date  . THYROIDECTOMY  02-2004    Family History  Problem Relation Age of Onset  . Hypertension Mother     Social History Social History   Tobacco Use  . Smoking status: Never Smoker  . Smokeless tobacco: Never Used  Substance Use Topics  . Alcohol use: No    Comment: Patient drinks 1-2 cups of coffee a day.  . Drug use: No    No Known Allergies  Current Outpatient Medications  Medication Sig Dispense Refill  . atorvastatin (LIPITOR) 40 MG tablet TK 1 T PO Q EVENING  5  . levothyroxine (SYNTHROID, LEVOTHROID) 125 MCG tablet Take 125 mcg by mouth daily before breakfast.    . amoxicillin-clavulanate (AUGMENTIN) 875-125 MG tablet Take 1 tablet by mouth every 12 (twelve) hours. (Patient not taking: Reported on 08/10/2018) 14 tablet 0  . aspirin 81 MG EC tablet Take 81 mg by mouth daily. Swallow whole.    . fluticasone (FLONASE) 50 MCG/ACT nasal spray Place 2 sprays into both nostrils daily. (Patient not taking: Reported on 08/10/2018) 1 g 0  . ipratropium (ATROVENT) 0.06 % nasal spray Place 2 sprays into both nostrils 4 (four) times daily. (Patient not taking: Reported on 08/10/2018) 15 mL 0   No current facility-administered medications for this visit.     Review of Systems Review of Systems  Genitourinary: Negative for dysuria, hematuria, menstrual problem, vaginal bleeding and vaginal discharge.    Blood pressure (!) 142/84, pulse 71,  weight 182 lb 8 oz (82.8 kg).  Physical Exam Physical Exam  Constitutional: She is oriented to person, place, and time. She appears well-developed. No distress.  Pulmonary/Chest: Effort normal.  Neurological: She is alert and oriented to person, place, and time.  Psychiatric: She has a normal mood and affect. Her behavior is normal.    Data Reviewed Pap 2019 and 2015  Assessment    Procedure Details  The risks and benefits of the procedure and Written informed consent obtained.  Speculum placed in vagina and excellent visualization of cervix achieved, cervix swabbed x 3 with acetic acid solution. Endocervix without lesion, SCJ and TZ seen. Minimal AWE anterior cervix Specimens: ECC and Bx at 12  Complications: none.     Plan    Specimens labelled and sent to Pathology. Notify of Pathology results in 2 weeks.  Mychart message is acceptable she says   Scheryl Darter 09/06/2018, 8:50 AM

## 2018-09-06 NOTE — Patient Instructions (Signed)
Colposcopy, Care After  This sheet gives you information about how to care for yourself after your procedure. Your doctor may also give you more specific instructions. If you have problems or questions, contact your doctor.  What can I expect after the procedure?  If you did not have a tissue sample removed (did not have a biopsy), you may only have some spotting for a few days. You can go back to your normal activities.  If you had a tissue sample removed, it is common to have:  · Soreness and pain. This may last for a few days.  · Light-headedness.  · Mild bleeding from your vagina or dark-colored, grainy discharge from your vagina. This may last for a few days. You may need to wear a sanitary pad.  · Spotting for at least 48 hours after the procedure.    Follow these instructions at home:  · Take over-the-counter and prescription medicines only as told by your doctor. Ask your doctor what medicines you can start taking again. This is very important if you take blood-thinning medicine.  · Do not drive or use heavy machinery while taking prescription pain medicine.  · For 3 days, or as long as your doctor tells you, avoid:  ? Douching.  ? Using tampons.  ? Having sex.  · If you use birth control (contraception), keep using it.  · Limit activity for the first day after the procedure. Ask your doctor what activities are safe for you.  · It is up to you to get the results of your procedure. Ask your doctor when your results will be ready.  · Keep all follow-up visits as told by your doctor. This is important.  Contact a doctor if:  · You get a skin rash.  Get help right away if:  · You are bleeding a lot from your vagina. It is a lot of bleeding if you are using more than one pad an hour for 2 hours in a row.  · You have clumps of blood (blood clots) coming from your vagina.  · You have a fever.  · You have chills  · You have pain in your lower belly (pelvic area).  · You have signs of infection, such as vaginal  discharge that is:  ? Different than usual.  ? Yellow.  ? Bad-smelling.  · You have very pain or cramps in your lower belly that do not get better with medicine.  · You feel light-headed.  · You feel dizzy.  · You pass out (faint).  Summary  · If you did not have a tissue sample removed (did not have a biopsy), you may only have some spotting for a few days. You can go back to your normal activities.  · If you had a tissue sample removed, it is common to have mild pain and spotting for 48 hours.  · For 3 days, or as long as your doctor tells you, avoid douching, using tampons and having sex.  · Get help right away if you have bleeding, very bad pain, or signs of infection.  This information is not intended to replace advice given to you by your health care provider. Make sure you discuss any questions you have with your health care provider.  Document Released: 04/19/2008 Document Revised: 07/21/2016 Document Reviewed: 07/21/2016  Elsevier Interactive Patient Education © 2018 [employer]

## 2018-09-06 NOTE — Progress Notes (Signed)
Pt is here for colpo after abnormal pap 08/10/18. ASCUS and positive HPV.

## 2018-09-07 ENCOUNTER — Encounter: Payer: Medicaid Other | Admitting: Obstetrics & Gynecology

## 2018-09-21 ENCOUNTER — Ambulatory Visit
Admission: RE | Admit: 2018-09-21 | Discharge: 2018-09-21 | Disposition: A | Discharge status: Home / Self Care | Payer: Medicaid Other | Source: Ambulatory Visit | Attending: Internal Medicine | Admitting: Internal Medicine

## 2018-09-21 DIAGNOSIS — E2839 Other primary ovarian failure: Secondary | ICD-10-CM

## 2018-10-03 ENCOUNTER — Encounter: Payer: Self-pay | Admitting: Obstetrics

## 2018-10-03 ENCOUNTER — Ambulatory Visit: Payer: Medicaid Other | Admitting: Obstetrics and Gynecology

## 2018-10-03 ENCOUNTER — Encounter: Payer: Self-pay | Admitting: Obstetrics and Gynecology

## 2018-10-03 DIAGNOSIS — D069 Carcinoma in situ of cervix, unspecified: Secondary | ICD-10-CM | POA: Insufficient documentation

## 2018-10-03 NOTE — Progress Notes (Signed)
Patient ID: Yesenia Salas, female   DOB: [birth year], 57 y.o.   MRN: [id number] Pt here for LEEP Unable to perform due to missing grounding cord. Pt will be rescheduled ASAP

## 2018-10-25 ENCOUNTER — Ambulatory Visit (INDEPENDENT_AMBULATORY_CARE_PROVIDER_SITE_OTHER): Payer: Medicaid Other | Admitting: Obstetrics & Gynecology

## 2018-10-25 ENCOUNTER — Encounter: Payer: Self-pay | Admitting: Obstetrics & Gynecology

## 2018-10-25 ENCOUNTER — Other Ambulatory Visit (HOSPITAL_COMMUNITY)
Admission: RE | Admit: 2018-10-25 | Discharge: 2018-10-25 | Disposition: A | Discharge status: Home / Self Care | Payer: Medicaid Other | Source: Ambulatory Visit | Attending: Obstetrics & Gynecology | Admitting: Obstetrics & Gynecology

## 2018-10-25 VITALS — BP 161/83 | HR 89 | Ht 64.0 in | Wt 185.8 lb

## 2018-10-25 DIAGNOSIS — N871 Moderate cervical dysplasia: Secondary | ICD-10-CM

## 2018-10-25 DIAGNOSIS — D069 Carcinoma in situ of cervix, unspecified: Secondary | ICD-10-CM | POA: Insufficient documentation

## 2018-10-25 NOTE — Progress Notes (Signed)
Presents for LEEP.

## 2018-10-25 NOTE — Progress Notes (Signed)
  Patient presents for LEEP Patient identified, informed consent obtained, signed copy in chart, time out performed.  Pap smear and colposcopy reviewed.   Pap ASCUS pos HR HPV Colpo Biopsy CIN 2-3 ECC negative Teflon coated speculum with smoke evacuator placed.  Cervix visualized. Paracervical block placed.  Small Fischer size loop used to remove cone of cervix using blend of cut and cautery on LEEP machine.  Monsel's solution used for hemostasis.  Patient tolerated procedure well.  Patient given post procedure instructions.  Follow up in 12 months for repeat pap or as needed.  Adam PhenixArnold, James G, MD 10/25/2018

## 2018-10-27 ENCOUNTER — Encounter: Payer: Medicaid Other | Admitting: Obstetrics and Gynecology

## 2019-05-16 ENCOUNTER — Observation Stay (HOSPITAL_COMMUNITY)
Admission: EM | Admit: 2019-05-16 | Discharge: 2019-05-17 | Disposition: A | Discharge status: Home / Self Care | Payer: Medicaid Other | Attending: Internal Medicine | Admitting: Internal Medicine

## 2019-05-16 ENCOUNTER — Encounter (HOSPITAL_COMMUNITY): Payer: Self-pay | Admitting: *Deleted

## 2019-05-16 ENCOUNTER — Other Ambulatory Visit: Payer: Self-pay

## 2019-05-16 ENCOUNTER — Emergency Department (HOSPITAL_COMMUNITY): Payer: Medicaid Other

## 2019-05-16 DIAGNOSIS — E039 Hypothyroidism, unspecified: Secondary | ICD-10-CM | POA: Diagnosis not present

## 2019-05-16 DIAGNOSIS — R030 Elevated blood-pressure reading, without diagnosis of hypertension: Secondary | ICD-10-CM | POA: Diagnosis not present

## 2019-05-16 DIAGNOSIS — G43909 Migraine, unspecified, not intractable, without status migrainosus: Principal | ICD-10-CM

## 2019-05-16 DIAGNOSIS — G459 Transient cerebral ischemic attack, unspecified: Secondary | ICD-10-CM | POA: Insufficient documentation

## 2019-05-16 DIAGNOSIS — G43109 Migraine with aura, not intractable, without status migrainosus: Secondary | ICD-10-CM

## 2019-05-16 DIAGNOSIS — R739 Hyperglycemia, unspecified: Secondary | ICD-10-CM | POA: Diagnosis not present

## 2019-05-16 DIAGNOSIS — Z20828 Contact with and (suspected) exposure to other viral communicable diseases: Secondary | ICD-10-CM | POA: Insufficient documentation

## 2019-05-16 DIAGNOSIS — R200 Anesthesia of skin: Secondary | ICD-10-CM | POA: Diagnosis present

## 2019-05-16 LAB — COMPREHENSIVE METABOLIC PANEL
ALT: 29 U/L (ref 0–44)
AST: 24 U/L (ref 15–41)
Albumin: 4.1 g/dL (ref 3.5–5.0)
Alkaline Phosphatase: 74 U/L (ref 38–126)
Anion gap: 11 (ref 5–15)
BUN: 10 mg/dL (ref 6–20)
CO2: 22 mmol/L (ref 22–32)
Calcium: 9.5 mg/dL (ref 8.9–10.3)
Chloride: 103 mmol/L (ref 98–111)
Creatinine, Ser: 0.67 mg/dL (ref 0.44–1.00)
GFR calc Af Amer: >60 mL/min (ref >60)
GFR calc non Af Amer: >60 mL/min (ref >60)
Glucose, Bld: 151 mg/dL — ABNORMAL HIGH (ref 70–99)
Potassium: 3.5 mmol/L (ref 3.5–5.1)
Sodium: 136 mmol/L (ref 135–145)
Total Bilirubin: 0.8 mg/dL (ref 0.3–1.2)
Total Protein: 7.6 g/dL (ref 6.5–8.1)

## 2019-05-16 LAB — PROTIME-INR
INR: 1 (ref 0.8–1.2)
Prothrombin Time: 13.2 seconds (ref 11.4–15.2)

## 2019-05-16 LAB — CBC
HCT: 40.3 % (ref 36.0–46.0)
Hemoglobin: 12.9 g/dL (ref 12.0–15.0)
MCH: 30.2 pg (ref 26.0–34.0)
MCHC: 32 g/dL (ref 30.0–36.0)
MCV: 94.4 fL (ref 80.0–100.0)
Platelets: 297 10*3/uL (ref 150–400)
RBC: 4.27 MIL/uL (ref 3.87–5.11)
RDW: 12.3 % (ref 11.5–15.5)
WBC: 6.2 10*3/uL (ref 4.0–10.5)
nRBC: 0 % (ref 0.0–0.2)

## 2019-05-16 LAB — DIFFERENTIAL
Abs Immature Granulocytes: 0.02 10*3/uL (ref 0.00–0.07)
Basophils Absolute: 0 10*3/uL (ref 0.0–0.1)
Basophils Relative: 1 %
Eosinophils Absolute: 0.2 10*3/uL (ref 0.0–0.5)
Eosinophils Relative: 3 %
Immature Granulocytes: 0 %
Lymphocytes Relative: 40 %
Lymphs Abs: 2.5 10*3/uL (ref 0.7–4.0)
Monocytes Absolute: 0.3 10*3/uL (ref 0.1–1.0)
Monocytes Relative: 5 %
Neutro Abs: 3.2 10*3/uL (ref 1.7–7.7)
Neutrophils Relative %: 51 %

## 2019-05-16 LAB — I-STAT CHEM 8, ED
BUN: 11 mg/dL (ref 6–20)
Calcium, Ion: 1.04 mmol/L — ABNORMAL LOW (ref 1.15–1.40)
Chloride: 105 mmol/L (ref 98–111)
Creatinine, Ser: 0.6 mg/dL (ref 0.44–1.00)
Glucose, Bld: 155 mg/dL — ABNORMAL HIGH (ref 70–99)
HCT: 40 % (ref 36.0–46.0)
Hemoglobin: 13.6 g/dL (ref 12.0–15.0)
Potassium: 3.5 mmol/L (ref 3.5–5.1)
Sodium: 138 mmol/L (ref 135–145)
TCO2: 26 mmol/L (ref 22–32)

## 2019-05-16 LAB — CBG MONITORING, ED: Glucose-Capillary: 143 mg/dL — ABNORMAL HIGH (ref 70–99)

## 2019-05-16 LAB — LIPID PANEL
Cholesterol: 300 mg/dL — ABNORMAL HIGH (ref 0–200)
HDL: 34 mg/dL — ABNORMAL LOW (ref >40)
LDL Cholesterol: 219 mg/dL — ABNORMAL HIGH (ref 0–99)
Total CHOL/HDL Ratio: 8.8 RATIO
Triglycerides: 236 mg/dL — ABNORMAL HIGH (ref <150)
VLDL: 47 mg/dL — ABNORMAL HIGH (ref 0–40)

## 2019-05-16 LAB — I-STAT BETA HCG BLOOD, ED (MC, WL, AP ONLY): I-stat hCG, quantitative: <5 m[IU]/mL (ref <5)

## 2019-05-16 LAB — APTT: aPTT: 31 seconds (ref 24–36)

## 2019-05-16 LAB — SARS CORONAVIRUS 2 BY RT PCR (HOSPITAL ORDER, PERFORMED IN ~~LOC~~ HOSPITAL LAB): SARS Coronavirus 2: NEGATIVE

## 2019-05-16 MED ORDER — LEVOTHYROXINE SODIUM 112 MCG PO TABS
112.0000 ug | ORAL_TABLET | Freq: Every day | ORAL | Status: DC
  Administered 2019-05-17: 112 ug via ORAL
  Filled 2019-05-16: qty 1

## 2019-05-16 MED ORDER — ENOXAPARIN SODIUM 40 MG/0.4ML ~~LOC~~ SOLN
40.0000 mg | Freq: Every day | SUBCUTANEOUS | Status: DC
  Administered 2019-05-17: 11:00:00 40 mg via SUBCUTANEOUS
  Filled 2019-05-16: qty 0.4

## 2019-05-16 MED ORDER — SODIUM CHLORIDE 0.9% FLUSH
3.0000 mL | Freq: Once | INTRAVENOUS | Status: DC
Start: 2019-05-16 — End: 2019-05-17

## 2019-05-16 MED ORDER — ACETAMINOPHEN 650 MG RE SUPP: 650.0000 mg | RECTAL | Status: DC | PRN

## 2019-05-16 MED ORDER — OXYCODONE-ACETAMINOPHEN 5-325 MG PO TABS
2.0000 | ORAL_TABLET | Freq: Once | ORAL | Status: AC
  Administered 2019-05-16: 2 via ORAL
  Filled 2019-05-16: qty 2

## 2019-05-16 MED ORDER — STROKE: EARLY STAGES OF RECOVERY BOOK
Freq: Once | Status: AC
  Administered 2019-05-17: 01:00:00
  Filled 2019-05-16: qty 1

## 2019-05-16 MED ORDER — ACETAMINOPHEN 325 MG PO TABS: 650.0000 mg | ORAL_TABLET | ORAL | Status: DC | PRN

## 2019-05-16 MED ORDER — ASPIRIN 325 MG PO TABS
325.0000 mg | ORAL_TABLET | Freq: Every day | ORAL | Status: DC
  Administered 2019-05-17 (×2): 325 mg via ORAL
  Filled 2019-05-16 (×2): qty 1

## 2019-05-16 MED ORDER — ACETAMINOPHEN 160 MG/5ML PO SOLN: 650.0000 mg | ORAL | Status: DC | PRN

## 2019-05-16 MED ORDER — SODIUM CHLORIDE 0.9 % IV SOLN
INTRAVENOUS | Status: DC
  Administered 2019-05-17: 01:00:00 via INTRAVENOUS

## 2019-05-16 MED ORDER — SENNOSIDES-DOCUSATE SODIUM 8.6-50 MG PO TABS: 1.0000 | ORAL_TABLET | Freq: Every evening | ORAL | Status: DC | PRN

## 2019-05-16 MED ORDER — ATORVASTATIN CALCIUM 40 MG PO TABS: 40.0000 mg | ORAL_TABLET | Freq: Every day | ORAL | Status: DC

## 2019-05-16 NOTE — ED Provider Notes (Signed)
[facility]   CSN: [id number] Arrival date & time: 05/16/19  1301     History   Chief Complaint Chief Complaint  Patient presents with  . Numbness    HPI Yesenia Salas is a 58 y.o. female.     HPI  58 year old female last known normal Monday evening when she went to bed reports that she is having left-sided facial numbness, left arm pain, numbness, weakness, and left leg weakness with tingling and numbness.  She reports the symptoms have been stable since yesterday.  She is able to walk.  She denies any problems with speech, vision, or swallowing.  She denies headache, or blood thinners.  He has not had a prior history of stroke.  Past Medical History:  Diagnosis Date  . Hypothyroidism, postsurgical     Patient Active Problem List   Diagnosis Date Noted  . Severe cervical dysplasia, histologically confirmed 10/03/2018  . ASCUS with positive high risk HPV cervical 09/06/2018  . Unspecified hypothyroidism 10/17/2007    Past Surgical History:  Procedure Laterality Date  . THYROIDECTOMY  02-2004     OB History    Gravida  3   Para  2   Term  2   Preterm      AB  1   Living  2     SAB      TAB      Ectopic      Multiple      Live Births               Home Medications    Prior to Admission medications   Medication Sig Start Date End Date Taking? Authorizing Provider  Levothyroxine Sodium 125 MCG CAPS Take 112 mcg by mouth daily before breakfast.    Yes [provider]  amoxicillin-clavulanate (AUGMENTIN) 875-125 MG tablet Take 1 tablet by mouth every 12 (twelve) hours. Patient not taking: Reported on 08/10/2018 05/19/18   Belinda FisherYu, Amy V, PA-C  fluticasone [facility](FLONASE) 50 MCG/ACT nasal spray Place 2 sprays into both nostrils daily. Patient not taking: Reported on 08/10/2018 05/19/18   Belinda FisherYu, Amy V, PA-C  ipratropium (ATROVENT) 0.06 % nasal spray Place 2 sprays into both nostrils 4 (four) times  daily. Patient not taking: Reported on 08/10/2018 05/19/18   Lurline IdolYu, Amy V, PA-C    Family History Family History  Problem Relation Age of Onset  . Hypertension Mother     Social History Social History   Tobacco Use  . Smoking status: Never Smoker  . Smokeless tobacco: Never Used  Substance Use Topics  . Alcohol use: No    Comment: Patient drinks 1-2 cups of coffee a day.  . Drug use: No     Allergies   Patient has no known allergies.   Review of Systems Review of Systems  All other systems reviewed and are negative.    Physical Exam Updated Vital Signs BP (!) 151/92   Pulse 64   Temp 98.2 F (36.8 C) (Oral)   Resp (!) 27   SpO2 95%   Physical Exam Vitals signs and nursing note reviewed.  Constitutional:      General: She is not in acute distress.    Appearance: Normal appearance. She is obese. She is not ill-appearing.  HENT:     Head: Normocephalic.     Right Ear: External ear normal.     Left Ear: External ear normal.     Mouth/Throat:  Mouth: Mucous membranes are moist.  Eyes:     Pupils: Pupils are equal, round, and reactive to light.  Neck:     Musculoskeletal: Normal range of motion.  Cardiovascular:     Rate and Rhythm: Normal rate and regular rhythm.  Pulmonary:     Effort: Pulmonary effort is normal.     Breath sounds: Normal breath sounds.  Abdominal:     General: Abdomen is flat.     Palpations: Abdomen is soft.  Musculoskeletal: Normal range of motion.  Skin:    General: Skin is warm and dry.     Capillary Refill: Capillary refill takes less than 2 seconds.  Neurological:     Mental Status: She is alert.     Cranial Nerves: No cranial nerve deficit.     Sensory: Sensory deficit present.     Motor: Weakness present.     Coordination: Coordination normal.     Deep Tendon Reflexes: Reflexes normal.     Comments: Left leg drift Decreased sensation left to right face, arm, and leg  Psychiatric:        Mood and Affect: Mood normal.       ED Treatments / Results  Labs (all labs ordered are listed, but only abnormal results are displayed) Labs Reviewed  COMPREHENSIVE METABOLIC PANEL - Abnormal; Notable for the following components:      Result Value   Glucose, Bld 151 (*)    All other components within normal limits  I-STAT CHEM 8, ED - Abnormal; Notable for the following components:   Glucose, Bld 155 (*)    Calcium, Ion 1.04 (*)    All other components within normal limits  CBG MONITORING, ED - Abnormal; Notable for the following components:   Glucose-Capillary 143 (*)    All other components within normal limits  PROTIME-INR  APTT  CBC  DIFFERENTIAL  I-STAT BETA HCG BLOOD, ED ([facility], [facility], [facility] ONLY)    EKG EKG Interpretation  Date/Time:  Wednesday May 16 2019 14:17:54 EDT Ventricular Rate:  66 PR Interval:    QRS Duration: 79 QT Interval:  409 QTC Calculation: 429 R Axis:   32 Text Interpretation:  Sinus rhythm Borderline T abnormalities, anterior leads Confirmed by Margarita Grizzleay, Harvey Lingo [id number]) on 05/16/2019 7:21:11 PM   Radiology Ct Head Wo Contrast  Result Date: 05/16/2019 CLINICAL DATA:  Left facial, eye, and shoulder numbness, possible stroke EXAM: CT HEAD WITHOUT CONTRAST TECHNIQUE: Contiguous axial images were obtained from the base of the skull through the vertex without intravenous contrast. COMPARISON:  02/26/2013 FINDINGS: Brain: No evidence of acute infarction, hemorrhage, hydrocephalus, extra-axial collection or mass lesion/mass effect. Vascular: No hyperdense vessel or unexpected calcification. Skull: Normal. Negative for fracture or focal lesion. Sinuses/Orbits: No acute finding. Other: None. IMPRESSION: No acute intracranial pathology. Electronically Signed   By: Lauralyn PrimesAlex  Bibbey M.D.   On: 05/16/2019 14:12    Procedures Procedures (including critical care time)  Medications Ordered in ED Medications  sodium chloride flush (NS) 0.9 % injection 3 mL (has no administration in time range)      Initial Impression / Assessment and Plan / ED Course  I have reviewed the triage vital signs and the nursing notes.  Pertinent labs & imaging results that were available during my care of the patient were reviewed by me and considered in my medical decision making (see chart for details).        Patient care discussed with neuro hospitalist.  CT obtained and normal.  Labs with  hyperglycemia at 150 but otherwise normal plan MRI brain and MRI/MRA brain per his recommendation.  Patient's blood pressures have ranged from 1 46-6 56 systolically and 59-93 diastolically Patient's MRI and MRA results are currently pending.   Discussed with neurohospitalist and recommend admission for TIA work up with risk factors- hyperglycemia and hypertension with symptoms waxing and waning Discussed with Dr. Jonelle Sidle and will see for admission Final Clinical Impressions(s) / ED Diagnoses   Final diagnoses:  TIA (transient ischemic attack)    ED Discharge Orders    None       Pattricia Boss, MD 05/16/19 2040

## 2019-05-16 NOTE — H&P (Signed)
History and Physical   Yesenia Salas UJW:[id number] DOB: 08/31/1961 DOA: 05/16/2019  Referring MD/NP/PA: Dr. Rosalia Hammersay  PCP: Fleet ContrasAvbuere, Edwin, MD   Outpatient Specialists: None  Patient coming from: Home  Chief Complaint: Left-sided weakness  HPI: Yesenia Salas is a 58 y.o. female with medical history significant of hypothyroidism, history of thyroidectomy, history of cervical dysplasia who presented to the ER with left-sided weakness since yesterday.  Patient also has some numbness on the left side especially the face.  She did feel tingling sensation in the area.  Patient able to walk but no problem with her speech no problem with strength on the right side.  She was worked up for CVA in the ER including MRI of the brain that was negative.  Patient however is visibly symptomatic.  She also has elevated blood pressure without prior diagnosis of hypertension.  Also elevated glucose without prior diagnosis of diabetes.  She is obese.  Consultation with neurology was done who recommended TIA work-up especially with these undiagnosed and on address risk factors.  Patient is being admitted therefore for full TIA work-up.  She denied any other complaint at the moment.  No headaches.  No prior CVAs and no family history of known CVAs...  ED Course: Temperature is 98.2 blood pressure 169/108 pulse 85 respiratory 27 oxygen sats 87% room air.  CBC and BMP all within normal except for glucose of 155.  Head CT without contrast showed no acute findings.  MRI of the brain with MRA showed no acute findings.  Patient being admitted for TIA work-up.  Review of Systems: As per HPI otherwise 10 point review of systems negative.    Past Medical History:  Diagnosis Date   Hypothyroidism, postsurgical     Past Surgical History:  Procedure Laterality Date   THYROIDECTOMY  02-2004     reports that she has never smoked. She has never used smokeless tobacco. She reports that she does not drink alcohol or  use drugs.  No Known Allergies  Family History  Problem Relation Age of Onset   Hypertension Mother      Prior to Admission medications   Medication Sig Start Date End Date Taking? Authorizing Provider  Levothyroxine Sodium 125 MCG CAPS Take 112 mcg by mouth daily before breakfast.    Yes [provider]  amoxicillin-clavulanate (AUGMENTIN) 875-125 MG tablet Take 1 tablet by mouth every 12 (twelve) hours. Patient not taking: Reported on 08/10/2018 05/19/18   Belinda FisherYu, Amy V, PA-C  fluticasone [facility](FLONASE) 50 MCG/ACT nasal spray Place 2 sprays into both nostrils daily. Patient not taking: Reported on 08/10/2018 05/19/18   Belinda FisherYu, Amy V, PA-C  ipratropium (ATROVENT) 0.06 % nasal spray Place 2 sprays into both nostrils 4 (four) times daily. Patient not taking: Reported on 08/10/2018 05/19/18   Lurline IdolYu, Amy V, PA-C    Physical Exam: Vitals:   05/16/19 1530 05/16/19 1630 05/16/19 2000 05/16/19 2000  BP: 137/90 (!) 151/92 (!) 169/108 (!) 169/108  Pulse: 67 64 85 77  Resp: 19 (!) 27  17  Temp:      TempSrc:    Oral  SpO2: (!) 87% 95% 100% 100%      Constitutional: NAD, calm, Morbidly obese, comfortable Vitals:   05/16/19 1530 05/16/19 1630 05/16/19 2000 05/16/19 2000  BP: 137/90 (!) 151/92 (!) 169/108 (!) 169/108  Pulse: 67 64 85 77  Resp: 19 (!) 27  17  Temp:      TempSrc:    Oral  SpO2: Marland Kitchen(!)  87% 95% 100% 100%   Eyes: PERRL, lids and conjunctivae normal ENMT: Mucous membranes are moist. Posterior pharynx clear of any exudate or lesions.Normal dentition.  Neck: normal, supple, no masses, no thyromegaly Respiratory: clear to auscultation bilaterally, no wheezing, no crackles. Normal respiratory effort. No accessory muscle use.  Cardiovascular: Regular rate and rhythm, no murmurs / rubs / gallops. No extremity edema. 2+ pedal pulses. No carotid bruits.  Abdomen: no tenderness, no masses palpated. No hepatosplenomegaly. Bowel sounds positive.  Musculoskeletal: no clubbing / cyanosis. No joint  deformity upper and lower extremities. Good ROM, no contractures. Normal muscle tone.  Skin: no rashes, lesions, ulcers. No induration Neurologic: CN 2-12 grossly intact. Sensation intact, DTR normal. Strength 5/5 in all 4. No focal neurologic deficit Psychiatric: Normal judgment and insight. Alert and oriented x 3. Normal mood.     Labs on Admission: I have personally reviewed following labs and imaging studies  CBC: Recent Labs  Lab 05/16/19 1330 05/16/19 1346  WBC 6.2  --   NEUTROABS 3.2  --   HGB 12.9 13.6  HCT 40.3 40.0  MCV 94.4  --   PLT 297  --    Basic Metabolic Panel: Recent Labs  Lab 05/16/19 1330 05/16/19 1346  NA 136 138  K 3.5 3.5  CL 103 105  CO2 22  --   GLUCOSE 151* 155*  BUN 10 11  CREATININE 0.67 0.60  CALCIUM 9.5  --    GFR: CrCl cannot be calculated (Unknown ideal weight.). Liver Function Tests: Recent Labs  Lab 05/16/19 1330  AST 24  ALT 29  ALKPHOS 74  BILITOT 0.8  PROT 7.6  ALBUMIN 4.1   No results for input(s): LIPASE, AMYLASE in the last 168 hours. No results for input(s): AMMONIA in the last 168 hours. Coagulation Profile: Recent Labs  Lab 05/16/19 1330  INR 1.0   Cardiac Enzymes: No results for input(s): CKTOTAL, CKMB, CKMBINDEX, TROPONINI in the last 168 hours. BNP (last 3 results) No results for input(s): PROBNP in the last 8760 hours. HbA1C: No results for input(s): HGBA1C in the last 72 hours. CBG: Recent Labs  Lab 05/16/19 1415  GLUCAP 143*   Lipid Profile: No results for input(s): CHOL, HDL, LDLCALC, TRIG, CHOLHDL, LDLDIRECT in the last 72 hours. Thyroid Function Tests: No results for input(s): TSH, T4TOTAL, FREET4, T3FREE, THYROIDAB in the last 72 hours. Anemia Panel: No results for input(s): VITAMINB12, FOLATE, FERRITIN, TIBC, IRON, RETICCTPCT in the last 72 hours. Urine analysis:    Component Value Date/Time   COLORURINE YELLOW 01/11/2007 2015   APPEARANCEUR CLEAR 01/11/2007 2015   LABSPEC 1.018  01/11/2007 2015   PHURINE 5.5 01/11/2007 2015   GLUCOSEU NEG mg/dL 16/10/960402/27/2008 54092015   BILIRUBINUR NEG 01/11/2007 2015   KETONESUR NEG mg/dL 81/19/147802/27/2008 29562015   PROTEINUR NEG mg/dL 21/30/865702/27/2008 84692015   UROBILINOGEN 0.2 01/11/2007 2015   NITRITE NEG 01/11/2007 2015   LEUKOCYTESUR NEG 01/11/2007 2015   Sepsis Labs: @LABRCNTIP (procalcitonin:4,lacticidven:4) )No results found for this or any previous visit (from the past 240 hour(s)).   Radiological Exams on Admission: Ct Head Wo Contrast  Result Date: 05/16/2019 CLINICAL DATA:  Left facial, eye, and shoulder numbness, possible stroke EXAM: CT HEAD WITHOUT CONTRAST TECHNIQUE: Contiguous axial images were obtained from the base of the skull through the vertex without intravenous contrast. COMPARISON:  02/26/2013 FINDINGS: Brain: No evidence of acute infarction, hemorrhage, hydrocephalus, extra-axial collection or mass lesion/mass effect. Vascular: No hyperdense vessel or unexpected calcification. Skull: Normal. Negative for fracture  or focal lesion. Sinuses/Orbits: No acute finding. Other: None. IMPRESSION: No acute intracranial pathology. Electronically Signed   By: Lauralyn PrimesAlex  Bibbey M.D.   On: 05/16/2019 14:12   Mr Angio Head Wo Contrast  Result Date: 05/16/2019 CLINICAL DATA:  58 y/o  F; left-sided numbness. EXAM: MRI HEAD WITHOUT CONTRAST MRA HEAD WITHOUT CONTRAST TECHNIQUE: Multiplanar, multiecho pulse sequences of the brain and surrounding structures were obtained without intravenous contrast. Angiographic images of the head were obtained using MRA technique without contrast. COMPARISON:  05/16/2019 CT head FINDINGS: MRI HEAD FINDINGS Brain: No acute infarction, hemorrhage, hydrocephalus, extra-axial collection or mass lesion. No structural or signal abnormality of the brain identified. Vascular: Normal flow voids. Skull and upper cervical spine: Normal marrow signal. Sinuses/Orbits: Negative. Other: None. MRA HEAD FINDINGS Internal carotid arteries:   Patent. Anterior cerebral arteries:  Patent. Middle cerebral arteries: Patent. Anterior communicating artery: Patent. Posterior communicating arteries:  Patent.  No right identified. Posterior cerebral arteries:  Patent. Basilar artery:  Patent. Vertebral arteries:  Patent. No evidence of high-grade stenosis, large vessel occlusion, or aneurysm unless noted above. IMPRESSION: 1. No acute intracranial abnormality.  Unremarkable MRI brain. 2. Negative MRA of the head. Electronically Signed   By: Mitzi HansenLance  Furusawa-Stratton M.D.   On: 05/16/2019 19:36   Mr Brain Wo Contrast  Result Date: 05/16/2019 CLINICAL DATA:  58 y/o  F; left-sided numbness. EXAM: MRI HEAD WITHOUT CONTRAST MRA HEAD WITHOUT CONTRAST TECHNIQUE: Multiplanar, multiecho pulse sequences of the brain and surrounding structures were obtained without intravenous contrast. Angiographic images of the head were obtained using MRA technique without contrast. COMPARISON:  05/16/2019 CT head FINDINGS: MRI HEAD FINDINGS Brain: No acute infarction, hemorrhage, hydrocephalus, extra-axial collection or mass lesion. No structural or signal abnormality of the brain identified. Vascular: Normal flow voids. Skull and upper cervical spine: Normal marrow signal. Sinuses/Orbits: Negative. Other: None. MRA HEAD FINDINGS Internal carotid arteries:  Patent. Anterior cerebral arteries:  Patent. Middle cerebral arteries: Patent. Anterior communicating artery: Patent. Posterior communicating arteries:  Patent.  No right identified. Posterior cerebral arteries:  Patent. Basilar artery:  Patent. Vertebral arteries:  Patent. No evidence of high-grade stenosis, large vessel occlusion, or aneurysm unless noted above. IMPRESSION: 1. No acute intracranial abnormality.  Unremarkable MRI brain. 2. Negative MRA of the head. Electronically Signed   By: Mitzi HansenLance  Furusawa-Stratton M.D.   On: 05/16/2019 19:36    EKG: Independently reviewed.  It shows normal sinus rhythm with a rate of 66.   Normal intervals.  Flattened T waves in the lateral leads and anterior leads.  No significant change from previous.  Assessment/Plan Principal Problem:   TIA (transient ischemic attack) Active Problems:   Hypothyroidism   Blood pressure elevated without history of HTN   Hyperglycemia     #1 TIA: Symptoms appear to have resolved.  No significant weakness at the moment.  We will admit overnight for observation.  Check carotid Dopplers and echocardiogram.  May get PT evaluation.  Monitor blood pressure and get A1c.  If remain abnormal patient will be initiated on treatment for hypertension and diabetes prior to leaving the hospital.  #2 hypothyroidism: Obtain TSH.  Continue with levothyroxine.  #3 elevated blood pressure: No reported history of hypertension.  Monitor blood pressure overnight.  If persistently elevated initiate treatment prior to discharge.  #4 hyperglycemia: Again no documented or reported history of diabetes.  Check hemoglobin A1c.  If abnormal manage accordingly.   DVT prophylaxis: Lovenox Code Status: Full code Family Communication: Discussed carefully with the patient  Disposition Plan: Home Consults called: Case discussed with neurology by ER Admission status: Observation  Severity of Illness: The appropriate patient status for this patient is OBSERVATION. Observation status is judged to be reasonable and necessary in order to provide the required intensity of service to ensure the patient's safety. The patient's presenting symptoms, physical exam findings, and initial radiographic and laboratory data in the context of their medical condition is felt to place them at decreased risk for further clinical deterioration. Furthermore, it is anticipated that the patient will be medically stable for discharge from the hospital within 2 midnights of admission. The following factors support the patient status of observation.   " The patient's presenting symptoms include  left-sided weakness. " The physical exam findings include normal neuro exam. " The initial radiographic and laboratory data are relatively normal except elevated glucose.     Barbette Merino MD [facility] Pager [phone]  If 7PM-7AM, please contact night-coverage [email or web address] Password [facility]  05/16/2019, 8:46 PM

## 2019-05-16 NOTE — Consult Note (Signed)
Referring Physician: Dr. Rosalia Hammersay    Chief Complaint: Right sided numbness  HPI: Yesenia Salas is an 58 y.o. female who presents with left facial numbness, left upper extremity pain with numbness/weakness, and LLE weakness with sensory numbness and paresthesia. LKN was Monday evening when she went to bed. She has had no vision changes, headache, speech changes, confusion or facial droop.   CT head in the ED revealed no acute abnormality.   LSN: Monday night tPA Given: No: Out of time window  Past Medical History:  Diagnosis Date  . Hypothyroidism, postsurgical     Past Surgical History:  Procedure Laterality Date  . THYROIDECTOMY  02-2004    Family History  Problem Relation Age of Onset  . Hypertension Mother    Social History:  reports that she has never smoked. She has never used smokeless tobacco. She reports that she does not drink alcohol or use drugs.  Allergies: No Known Allergies  Home Medications:  Levothyroxine Augmentin Flonase Atrovent  ROS: As per HPI, with all other systems negative.   Physical Examination: Blood pressure (!) 156/86, pulse 69, temperature 98.2 F (36.8 C), temperature source Oral, resp. rate 14, SpO2 96 %.  HEENT: [place]/AT Lungs: Respirations unlabored Ext: No edema  Neurologic Examination: Mental Status: Alert, oriented, thought content appropriate.  Speech fluent without evidence of aphasia.  Able to follow all commands without difficulty. Cranial Nerves: II:  Visual fields intact with no extinction to DSS.   III,IV, VI: No ptosis. EOMI. No nystagmus.  V,VII: No facial droop. Temp sensation equal bilaterally VIII: hearing intact to voice IX,X: no hypophonia XI: Symmetric XII: midline tongue extension  Motor: Right : Upper extremity   5/5    Left:     Upper extremity   5/5  Lower extremity   5/5     Lower extremity   5/5 No pronator drift.  Sensory: Temp and light touch intact throughout, bilaterally Deep Tendon Reflexes:   Normoactive x 4 Cerebellar: No ataxia with FNF bilaterally  Gait: Deferred  Results for orders placed or performed during the hospital encounter of 05/16/19 (from the past 48 hour(s))  Protime-INR     Status: None   Collection Time: 05/16/19  1:30 PM  Result Value Ref Range   Prothrombin Time 13.2 11.4 - 15.2 seconds   INR 1.0 0.8 - 1.2    Comment: (NOTE) INR goal varies based on device and disease states. Performed at [facility], [address] [place], [place] [address]   APTT     Status: None   Collection Time: 05/16/19  1:30 PM  Result Value Ref Range   aPTT 31 24 - 36 seconds    Comment: Performed at [facility], [address] [place], [place] [address]  CBC     Status: None   Collection Time: 05/16/19  1:30 PM  Result Value Ref Range   WBC 6.2 4.0 - 10.5 K/uL   RBC 4.27 3.87 - 5.11 MIL/uL   Hemoglobin 12.9 12.0 - 15.0 g/dL   HCT 09.840.3 11.936.0 - 14.746.0 %   MCV 94.4 80.0 - 100.0 fL   MCH 30.2 26.0 - 34.0 pg   MCHC 32.0 30.0 - 36.0 g/dL   RDW 82.912.3 56.211.5 - 13.015.5 %   Platelets 297 150 - 400 K/uL   nRBC 0.0 0.0 - 0.2 %    Comment: Performed at [facility], [address] [place], [place] [address]  Differential  Status: None   Collection Time: 05/16/19  1:30 PM  Result Value Ref Range   Neutrophils Relative % 51 %   Neutro Abs 3.2 1.7 - 7.7 K/uL   Lymphocytes Relative 40 %   Lymphs Abs 2.5 0.7 - 4.0 K/uL   Monocytes Relative 5 %   Monocytes Absolute 0.3 0.1 - 1.0 K/uL   Eosinophils Relative 3 %   Eosinophils Absolute 0.2 0.0 - 0.5 K/uL   Basophils Relative 1 %   Basophils Absolute 0.0 0.0 - 0.1 K/uL   Immature Granulocytes 0 %   Abs Immature Granulocytes 0.02 0.00 - 0.07 K/uL    Comment: Performed at [facility], [address] [place], [place] [address]  Comprehensive metabolic panel     Status: Abnormal   Collection Time: 05/16/19  1:30 PM  Result Value Ref Range   Sodium 136 135 - 145 mmol/L   Potassium 3.5 3.5 - 5.1 mmol/L    Chloride 103 98 - 111 mmol/L   CO2 22 22 - 32 mmol/L   Glucose, Bld 151 (H) 70 - 99 mg/dL   BUN 10 6 - 20 mg/dL   Creatinine, Ser 6.040.67 0.44 - 1.00 mg/dL   Calcium 9.5 8.9 - 54.010.3 mg/dL   Total Protein 7.6 6.5 - 8.1 g/dL   Albumin 4.1 3.5 - 5.0 g/dL   AST 24 15 - 41 U/L   ALT 29 0 - 44 U/L   Alkaline Phosphatase 74 38 - 126 U/L   Total Bilirubin 0.8 0.3 - 1.2 mg/dL   GFR calc non Af Amer >60 >60 mL/min   GFR calc Af Amer >60 >60 mL/min   Anion gap 11 5 - 15    Comment: Performed at [facility], [address] [place], [place] [address]  I-Stat beta hCG blood, ED     Status: None   Collection Time: 05/16/19  1:44 PM  Result Value Ref Range   I-stat hCG, quantitative <5.0 <5 mIU/mL   Comment 3            Comment:   GEST. AGE      CONC.  (mIU/mL)   <=1 WEEK        5 - 50     2 WEEKS       50 - 500     3 WEEKS       100 - 10,000     4 WEEKS     1,000 - 30,000        FEMALE AND NON-PREGNANT FEMALE:     LESS THAN 5 mIU/mL   I-stat chem 8, ED     Status: Abnormal   Collection Time: 05/16/19  1:46 PM  Result Value Ref Range   Sodium 138 135 - 145 mmol/L   Potassium 3.5 3.5 - 5.1 mmol/L   Chloride 105 98 - 111 mmol/L   BUN 11 6 - 20 mg/dL   Creatinine, Ser 1.470.60 0.44 - 1.00 mg/dL   Glucose, Bld 829155 (H) 70 - 99 mg/dL   Calcium, Ion 5.621.04 (L) 1.15 - 1.40 mmol/L   TCO2 26 22 - 32 mmol/L   Hemoglobin 13.6 12.0 - 15.0 g/dL   HCT 13.040.0 86.536.0 - 78.446.0 %  CBG monitoring, ED     Status: Abnormal   Collection Time: 05/16/19  2:15 PM  Result Value Ref Range   Glucose-Capillary 143 (H) 70 - 99 mg/dL   Comment 1 Notify RN    Comment 2  Document in Chart    Ct Head Wo Contrast  Result Date: 05/16/2019 CLINICAL DATA:  Left facial, eye, and shoulder numbness, possible stroke EXAM: CT HEAD WITHOUT CONTRAST TECHNIQUE: Contiguous axial images were obtained from the base of the skull through the vertex without intravenous contrast. COMPARISON:  02/26/2013 FINDINGS: Brain: No evidence of acute  infarction, hemorrhage, hydrocephalus, extra-axial collection or mass lesion/mass effect. Vascular: No hyperdense vessel or unexpected calcification. Skull: Normal. Negative for fracture or focal lesion. Sinuses/Orbits: No acute finding. Other: None. IMPRESSION: No acute intracranial pathology. Electronically Signed   By: Eddie Candle M.D.   On: 05/16/2019 14:12    Assessment: 58 y.o. female presenting with subjective left sided neurological deficits 1. Has a history of migraine headaches, but no headache with her current presentation. Has never had a complicated migraine. Migraine accompaniment is on the DDx, in addition to TIA or small stroke that is no longer asymptomatic.  2. CT head negative for acute abnormality 3. Neurological exam is nonfocal.  4. Stroke Risk Factors - None  Plan: 1. HgbA1c, fasting lipid panel 2. MRI, MRA of the brain without contrast 3. PT consult, OT consult, Speech consult 4. Echocardiogram 5. Carotid dopplers 6. Prophylactic therapy- ASA 325 mg po qd 7. Risk factor modification 8. Telemetry monitoring 9. Frequent neuro checks  Addendum: MRI brain and MRA head completed, revealing no acute abnormality and no occlusion or high grade arterial stenosis. Will need to complete TIA work up with TTE and cardiac telemetry. Continue ASA. Migraine accompaniment is now higher on the DDx and should be considered as more likely if TTE and cardiac telemetry are negative.   @Electronically  signed: Dr. Kerney Elbe 05/16/2019, 2:51 PM

## 2019-05-16 NOTE — Progress Notes (Signed)
Spoke with Dr. Jeanell Sparrow who said order could be changed to MR Brain Without since exam was to r/u stroke.  Ordered changed and verbal consent was sent to be signed.

## 2019-05-16 NOTE — ED Triage Notes (Signed)
Pt reports waking up Sunday morning with numbness to left side of face. Reports it increased to left shoulder area and weakness to her leg. Grips are equal and no facial droop noted at triage.

## 2019-05-17 ENCOUNTER — Observation Stay (HOSPITAL_BASED_OUTPATIENT_CLINIC_OR_DEPARTMENT_OTHER): Payer: Medicaid Other

## 2019-05-17 DIAGNOSIS — G43909 Migraine, unspecified, not intractable, without status migrainosus: Secondary | ICD-10-CM

## 2019-05-17 DIAGNOSIS — G459 Transient cerebral ischemic attack, unspecified: Secondary | ICD-10-CM

## 2019-05-17 DIAGNOSIS — R030 Elevated blood-pressure reading, without diagnosis of hypertension: Secondary | ICD-10-CM | POA: Diagnosis not present

## 2019-05-17 LAB — HEMOGLOBIN A1C
Hgb A1c MFr Bld: 6.6 % — ABNORMAL HIGH (ref 4.8–5.6)
Mean Plasma Glucose: 142.72 mg/dL

## 2019-05-17 LAB — LIPID PANEL
Cholesterol: 292 mg/dL — ABNORMAL HIGH (ref 0–200)
HDL: 28 mg/dL — ABNORMAL LOW (ref >40)
LDL Cholesterol: 229 mg/dL — ABNORMAL HIGH (ref 0–99)
Total CHOL/HDL Ratio: 10.4 RATIO
Triglycerides: 177 mg/dL — ABNORMAL HIGH (ref <150)
VLDL: 35 mg/dL (ref 0–40)

## 2019-05-17 LAB — ECHOCARDIOGRAM COMPLETE

## 2019-05-17 LAB — HIV ANTIBODY (ROUTINE TESTING W REFLEX): HIV Screen 4th Generation wRfx: NONREACTIVE

## 2019-05-17 MED ORDER — ATORVASTATIN CALCIUM 80 MG PO TABS: 80.0000 mg | ORAL_TABLET | Freq: Every day | ORAL | 0 refills | Status: DC

## 2019-05-17 MED ORDER — ATORVASTATIN CALCIUM 80 MG PO TABS: 80.0000 mg | ORAL_TABLET | Freq: Every day | ORAL | 0 refills | Status: AC

## 2019-05-17 MED ORDER — ASPIRIN 81 MG PO TBEC: 81.0000 mg | DELAYED_RELEASE_TABLET | Freq: Every day | ORAL | 0 refills | Status: DC

## 2019-05-17 MED ORDER — ATORVASTATIN CALCIUM 80 MG PO TABS: 80.0000 mg | ORAL_TABLET | Freq: Every day | ORAL | Status: DC

## 2019-05-17 MED ORDER — ASPIRIN EC 81 MG PO TBEC: 81.0000 mg | DELAYED_RELEASE_TABLET | Freq: Every day | ORAL | Status: DC

## 2019-05-17 MED ORDER — ASPIRIN 81 MG PO TBEC: 81.0000 mg | DELAYED_RELEASE_TABLET | Freq: Every day | ORAL | 0 refills | Status: AC

## 2019-05-17 MED FILL — ASPIRIN LOW DOSE 81 MG TBEC: 81 | 30 days supply | Qty: 30 | Fill #0

## 2019-05-17 MED FILL — ATORVASTATIN CALCIUM 80 MG: 80 | 30 days supply | Qty: 30 | Fill #0

## 2019-05-17 NOTE — Evaluation (Signed)
Speech Language Pathology Evaluation Patient Details Name: Yesenia Salas MRN: [id number] DOB: Mar 06, 1961 Today's Date: 05/17/2019 Time: 5009-3818 SLP Time Calculation (min) (ACUTE ONLY): 14 min  Problem List:  Patient Active Problem List   Diagnosis Date Noted  . TIA (transient ischemic attack) 05/16/2019  . Blood pressure elevated without history of HTN 05/16/2019  . Hyperglycemia 05/16/2019  . Severe cervical dysplasia, histologically confirmed 10/03/2018  . ASCUS with positive high risk HPV cervical 09/06/2018  . Hypothyroidism 10/17/2007   Past Medical History:  Past Medical History:  Diagnosis Date  . Hypothyroidism, postsurgical    Past Surgical History:  Past Surgical History:  Procedure Laterality Date  . THYROIDECTOMY  02-2004   HPI:  Pt is a 58 y.o. female with medical history significant of hypothyroidism, history of thyroidectomy, history of cervical dysplasia who presented to the ED with left-sided weakness. MRI of the brain was negative for acute changes.    Assessment / Plan / Recommendation Clinical Impression  Pt reported that she was living independently prior to admission and was employed full-time doing shipping and receiving. She denied any baseline or new deficits in speech, language or cognition. Her speech and language skills are currently within normal limits and no overt cognitive deficits were noted. Further skilled SLP services are not clinically indicated at this time. Pt, and nursing were educated regarding this and both parties verbalized understanding as well as agreement with plan of care.    SLP Assessment  SLP Recommendation/Assessment: Patient does not need any further Speech Lanaguage Pathology Services SLP Visit Diagnosis: Cognitive communication deficit (R41.841)    Follow Up Recommendations  None    Frequency and Duration           SLP Evaluation Cognition  Overall Cognitive Status: Within Functional Limits for tasks  assessed Arousal/Alertness: Awake/alert Orientation Level: Oriented X4 Attention: Focused;Sustained Focused Attention: Appears intact Sustained Attention: Appears intact Memory: Appears intact(Immediate: 4/4; delayed: 2/3; with cue: 1/1) Awareness: Appears intact Problem Solving: Appears intact(5/5) Executive Function: Reasoning Reasoning: Appears intact(3/3)       Comprehension  Auditory Comprehension Overall Auditory Comprehension: Appears within functional limits for tasks assessed Yes/No Questions: Within Functional Limits Basic Biographical Questions: (5/5) Complex Questions: (5/5) Paragraph Comprehension (via yes/no questions): (4/4) Commands: Within Functional Limits Two Step Basic Commands: (4/4) Multistep Basic Commands: (4/4) Conversation: Complex Visual Recognition/Discrimination Discrimination: Within Function Limits Reading Comprehension Reading Status: Within funtional limits    Expression Expression Primary Mode of Expression: Verbal Verbal Expression Overall Verbal Expression: Appears within functional limits for tasks assessed Initiation: No impairment Automatic Speech: Counting;Day of week;Month of year(WNL) Level of Generative/Spontaneous Verbalization: Conversation Repetition: No impairment(5/5) Naming: No impairment Responsive: (5/5) Confrontation: Within functional limits(10/10) Convergent: (Sentence completion: 5/5) Pragmatics: No impairment   Oral / Motor  Oral Motor/Sensory Function Overall Oral Motor/Sensory Function: Within functional limits Motor Speech Overall Motor Speech: Appears within functional limits for tasks assessed Respiration: Within functional limits Phonation: Normal Resonance: Within functional limits Articulation: Within functional limitis Intelligibility: Intelligible Motor Planning: Witnin functional limits Motor Speech Errors: Not applicable   Kayse Puccini I. Hardin Negus, [place], [place] Office  number [phone] Pager [place] 05/17/2019, 9:42 AM

## 2019-05-17 NOTE — Progress Notes (Signed)
  Echocardiogram 2D Echocardiogram has been performed.  Yesenia Salas 05/17/2019, 8:42 AM

## 2019-05-17 NOTE — Evaluation (Signed)
Physical Therapy Evaluation/Discharge Patient Details Name: Yesenia Salas MRN: [id number] DOB: 1961-01-14 Today's Date: 05/17/2019   History of Present Illness  58 y.o. female admitted on 05/16/19 with left sided weakness and numbness.  MRI did not show acute stroke.  Stroke workup pending for suspected TIA.  Pt with other significant PMH of thyroidectomy with post op hypothyroidism.    Clinical Impression  Pt is independent with all mobility.  Despite continued sx of numbness and mild weakness on her left UE/LE, it does not seem to affect her functional mobility or balance during gait.  She has two teenagers at home who can help if needed (but I don't think she will need much help).  PT to sign off as pt has no acute or follow up PT needs at this time.    Follow Up Recommendations No PT follow up    Equipment Recommendations  None recommended by PT    Recommendations for Other Services   NA    Precautions / Restrictions Precautions Precautions: None      Mobility  Bed Mobility Overal bed mobility: Independent                Transfers Overall transfer level: Independent                  Ambulation/Gait Ambulation/Gait assistance: Independent Gait Distance (Feet): 300 Feet Assistive device: None Gait Pattern/deviations: WFL(Within Functional Limits)   Gait velocity interpretation: >2.62 ft/sec, indicative of community ambulatory           Balance Overall balance assessment: Needs assistance                           High level balance activites: Backward walking;Direction changes;Turns;Sudden stops;Other (comment)(object pick up from floor) High Level Balance Comments: supervision for safety             Pertinent Vitals/Pain Pain Assessment: No/denies pain    Home Living Family/patient expects to be discharged to:: Private residence Living Arrangements: Children;Other (Comment)(2 teens) Available Help at Discharge:  Family;Available PRN/intermittently Type of Home: House Home Access: Level entry     Home Layout: One level Home Equipment: None      Prior Function Level of Independence: Independent         Comments: works full time, seated job     Hand Dominance   Dominant Hand: Right    Extremity/Trunk Assessment   Upper Extremity Assessment Upper Extremity Assessment: Defer to OT evaluation    Lower Extremity Assessment Lower Extremity Assessment: LLE deficits/detail LLE Deficits / Details: left leg mildly weaker and decreased sensation compared to right leg.  Can localize to LT, but feels "tingly".  Pt 4/5 throughout with MMT on L leg (bed level) and 5/5 R leg.   LLE Sensation: decreased light touch LLE Coordination: WNL    Cervical / Trunk Assessment Cervical / Trunk Assessment: Normal  Communication   Communication: No difficulties  Cognition Arousal/Alertness: Awake/alert Behavior During Therapy: WFL for tasks assessed/performed Overall Cognitive Status: Within Functional Limits for tasks assessed                                               Assessment/Plan    PT Assessment Patent does not need any further PT services         PT Goals (  Current goals can be found in the Care Plan section)  Acute Rehab PT Goals PT Goal Formulation: All assessment and education complete, DC therapy     AM-PAC PT "6 Clicks" Mobility  Outcome Measure Help needed turning from your back to your side while in a flat bed without using bedrails?: None Help needed moving from lying on your back to sitting on the side of a flat bed without using bedrails?: None Help needed moving to and from a bed to a chair (including a wheelchair)?: None Help needed standing up from a chair using your arms (e.g., wheelchair or bedside chair)?: None Help needed to walk in hospital room?: None Help needed climbing 3-5 steps with a railing? : None 6 Click Score: 24    End of Session    Activity Tolerance: Patient tolerated treatment well Patient left: in bed;with call bell/phone within reach;Other (comment)(took bed alarm off and gave her permission to go to bathroom) Nurse Communication: Other (comment)(pt ok to go to the bathroom on her own. ) PT Visit Diagnosis: Other symptoms and signs involving the nervous system (Z61.096(R29.898)    Time: [id number] PT Time Calculation (min) (ACUTE ONLY): 13 min   Charges:       Lurena Joinerebecca B. Linday Rhodes, PT, DPT  Acute Rehabilitation [phone] pager [phone] office  @ [facility] [place]: [phone]   PT Evaluation $PT Eval Moderate Complexity: 1 Mod         05/17/2019, 10:39 AM

## 2019-05-17 NOTE — Evaluation (Signed)
Occupational Therapy Evaluation Patient Details Name: Yesenia Salas MRN: [id number] DOB: 1961-10-14 Today's Date: 05/17/2019    History of Present Illness 58 y.o. female admitted on 05/16/19 with left sided weakness and numbness.  MRI did not show acute stroke.  Stroke workup pending for suspected TIA.  Pt with other significant PMH of thyroidectomy with post op hypothyroidism.     Clinical Impression   Pt PTA: living with family and independent. Pt currently with LUE weakness and fair coordination compared to RUE. Pt with L visual field in posterior portion- feels as though straining. Pt advised to see eye doctor upon d/c for visual deficits. OT can address any visual field impairments prior to d/c, although  Blurry vision was present prior to admission, but exacerbated with this admission in L eye. Vision to be tested in subsequent sessions. Pt set-upA to modified independent for ADL. No AD required. Pt would benefit from continued OT skilled services for visual deficits and weakness. OT following autely.      Follow Up Recommendations  No OT follow up    Equipment Recommendations  None recommended by OT    Recommendations for Other Services       Precautions / Restrictions Precautions Precautions: None Restrictions Weight Bearing Restrictions: No      Mobility Bed Mobility Overal bed mobility: Independent                Transfers Overall transfer level: Independent                    Balance Overall balance assessment: Modified Independent                             High Level Balance Comments: tub transfer           ADL either performed or assessed with clinical judgement   ADL Overall ADL's : At baseline                                       General ADL Comments: Pt donning socks, stood at sink from grooming and performing tub transfer simulation. Pt with no focal vision deficits - scanning, peripheral and  saccadic movements, WNLs. Pt reports straining when looking up on L eye.      Vision Baseline Vision/History: Wears glasses(c/o blurry vision prior to TIA) Wears Glasses: Reading only Patient Visual Report: Blurring of vision;Other (comment)(pt reports blurring vision prior to TIA.) Vision Assessment?: No apparent visual deficits     Perception     Praxis      Pertinent Vitals/Pain Pain Assessment: No/denies pain     Hand Dominance Right   Extremity/Trunk Assessment Upper Extremity Assessment Upper Extremity Assessment: Generalized weakness LUE Deficits / Details: weakness 4-/5 MM grade in LUE compared to 5/5 on RUE   Lower Extremity Assessment Lower Extremity Assessment: Defer to PT evaluation   Cervical / Trunk Assessment Cervical / Trunk Assessment: Normal   Communication Communication Communication: No difficulties   Cognition Arousal/Alertness: Awake/alert Behavior During Therapy: WFL for tasks assessed/performed Overall Cognitive Status: Within Functional Limits for tasks assessed                                     General Comments       Exercises  Shoulder Instructions      Home Living Family/patient expects to be discharged to:: Private residence Living Arrangements: Children;Other (Comment) Available Help at Discharge: Family;Available PRN/intermittently Type of Home: House Home Access: Level entry     Home Layout: One level     Bathroom Shower/Tub: Teacher, early years/pre: Standard     Home Equipment: None      Lives With: Other (Comment)(children)    Prior Functioning/Environment Level of Independence: Independent        Comments: works full time, seated job        OT Problem List: Decreased activity tolerance      OT Treatment/Interventions:      OT Goals(Current goals can be found in the care plan section) Acute Rehab OT Goals Patient Stated Goal: to go home OT Goal Formulation: With  patient Time For Goal Achievement: 05/31/19 Potential to Achieve Goals: Good ADL Goals Pt/caregiver will Perform Home Exercise Program: Increased strength;With written HEP provided;Independently Additional ADL Goal #1: Pt to perform vision exercises daily with independence.  OT Frequency:     Barriers to D/C:            Co-evaluation              AM-PAC OT "6 Clicks" Daily Activity     Outcome Measure Help from another person eating meals?: None Help from another person taking care of personal grooming?: None Help from another person toileting, which includes using toliet, bedpan, or urinal?: None Help from another person bathing (including washing, rinsing, drying)?: None Help from another person to put on and taking off regular upper body clothing?: None Help from another person to put on and taking off regular lower body clothing?: None 6 Click Score: 24   End of Session Nurse Communication: Mobility status  Activity Tolerance: Patient tolerated treatment well Patient left: in bed;with call bell/phone within reach  OT Visit Diagnosis: Unsteadiness on feet (R26.81);Muscle weakness (generalized) (M62.81)                Time: 1050-1110 OT Time Calculation (min): 20 min Charges:  OT General Charges $OT Visit: 1 Visit OT Evaluation $OT Eval Moderate Complexity: 1 Mod  Yesenia Salas) Yesenia Salas Acute Rehabilitation Services Pager: [phone] Office: [place] 05/17/2019, 5:08 PM

## 2019-05-17 NOTE — Progress Notes (Signed)
Carotid duplex  has been completed. Refer to [facility] under chart review to view preliminary results.   05/17/2019  2:20 PM Yesenia Salas, [name]

## 2019-05-17 NOTE — Progress Notes (Signed)
Patient given discharge teaching, able to verbalize understanding and teach back. No new questions or concerns. IV and tele removed. Pocket book returned to patient. Patient sister transport home.

## 2019-05-17 NOTE — Discharge Summary (Signed)
Physician Discharge Summary  Yesenia Salas RUE:[id number] DOB: 1961/11/09 DOA: 05/16/2019  PCP: Fleet Contras, MD  Admit date: 05/16/2019 Discharge date: 05/17/2019  Admitted From: Home Disposition:  Home  Recommendations for Outpatient Follow-up:  1. Follow up with PCP in 1-2 weeks 2. Follow up with Neurology as scheduled  Discharge Condition:Stable CODE STATUS:Full Diet recommendation: Heart healthy   Brief/Interim Summary: 58 y.o. female with medical history significant of hypothyroidism, history of thyroidectomy, history of cervical dysplasia who presented to the ER with left-sided weakness since yesterday.  Patient also has some numbness on the left side especially the face.  She did feel tingling sensation in the area.  Patient able to walk but no problem with her speech no problem with strength on the right side.  She was worked up for CVA in the ER including MRI of the brain that was negative.  Patient however is visibly symptomatic.  She also has elevated blood pressure without prior diagnosis of hypertension.  Also elevated glucose without prior diagnosis of diabetes.  She is obese.  Consultation with neurology was done who recommended TIA work-up especially with these undiagnosed and on address risk factors.  Patient is being admitted therefore for full TIA work-up.  She denied any other complaint at the moment.  No headaches.  No prior CVAs and no family history of known CVAs...  ED Course: Temperature is 98.2 blood pressure 169/108 pulse 85 respiratory 27 oxygen sats 87% room air.  CBC and BMP all within normal except for glucose of 155.  Head CT without contrast showed no acute findings.  MRI of the brain with MRA showed no acute findings.   Discharge Diagnoses:  Principal Problem:   Migraine Active Problems:   Hypothyroidism   Blood pressure elevated without history of HTN   Hyperglycemia   #1 TIA ruled out, migraine:  -Symptoms appear to have resolved at time of  presentation - carotid Dopplers and echocardiogram reviewed, unremarkable -Pt seen by neurology and case was discussed. Findings likely secondary to migraine in setting of stress -LDL noted to be elevated. Have prescribed statin  #2 hypothyroidism: Continue with levothyroxine.  #3 elevated blood pressure: BP noted to be stable  #4 hyperglycemia: Again no documented or reported history of diabetes.  A1c of 6.6   Discharge Instructions  Discharge Instructions    Ambulatory referral to Neurology   Complete by: As directed    Follow up with stroke clinic NP (Jessica Vanschaick or Darrol Angel, if both not available, consider Manson Allan, or Ahern) at [facility] in about 4 weeks. Thanks.     Allergies as of 05/17/2019   No Known Allergies     Medication List    STOP taking these medications   amoxicillin-clavulanate 875-125 MG tablet Commonly known as: AUGMENTIN   fluticasone 50 MCG/ACT nasal spray Commonly known as: FLONASE   ipratropium 0.06 % nasal spray Commonly known as: Atrovent     TAKE these medications   aspirin 81 MG EC tablet Take 1 tablet (81 mg total) by mouth daily. Start taking on: May 18, 2019 Notes to patient: Begin taking tomorrow 05/18/2019   atorvastatin 80 MG tablet Commonly known as: LIPITOR Take 1 tablet (80 mg total) by mouth daily at 6 PM. Notes to patient: Begin taking today 05/17/2019   Levothyroxine Sodium 125 MCG Caps Take 112 mcg by mouth daily before breakfast. Notes to patient: Begin taking tomorrow 05/18/2019      Follow-up Information    [facility]. Schedule  an appointment as soon as possible for a visit in 4 week(s).   Specialty: Neurology Contact information: [address] [place] [id number] [phone]       Fleet ContrasAvbuere, Edwin, MD. Schedule an appointment as soon as possible for a visit in 1 week(s).   Specialty: Internal Medicine Contact information: [address]  [place] [address] [phone]          No Known Allergies  Consultations:  Neurology  Procedures/Studies: Ct Head Wo Contrast  Result Date: 05/16/2019 CLINICAL DATA:  Left facial, eye, and shoulder numbness, possible stroke EXAM: CT HEAD WITHOUT CONTRAST TECHNIQUE: Contiguous axial images were obtained from the base of the skull through the vertex without intravenous contrast. COMPARISON:  02/26/2013 FINDINGS: Brain: No evidence of acute infarction, hemorrhage, hydrocephalus, extra-axial collection or mass lesion/mass effect. Vascular: No hyperdense vessel or unexpected calcification. Skull: Normal. Negative for fracture or focal lesion. Sinuses/Orbits: No acute finding. Other: None. IMPRESSION: No acute intracranial pathology. Electronically Signed   By: Lauralyn PrimesAlex  Bibbey M.D.   On: 05/16/2019 14:12   Mr Angio Head Wo Contrast  Result Date: 05/16/2019 CLINICAL DATA:  58 y/o  F; left-sided numbness. EXAM: MRI HEAD WITHOUT CONTRAST MRA HEAD WITHOUT CONTRAST TECHNIQUE: Multiplanar, multiecho pulse sequences of the brain and surrounding structures were obtained without intravenous contrast. Angiographic images of the head were obtained using MRA technique without contrast. COMPARISON:  05/16/2019 CT head FINDINGS: MRI HEAD FINDINGS Brain: No acute infarction, hemorrhage, hydrocephalus, extra-axial collection or mass lesion. No structural or signal abnormality of the brain identified. Vascular: Normal flow voids. Skull and upper cervical spine: Normal marrow signal. Sinuses/Orbits: Negative. Other: None. MRA HEAD FINDINGS Internal carotid arteries:  Patent. Anterior cerebral arteries:  Patent. Middle cerebral arteries: Patent. Anterior communicating artery: Patent. Posterior communicating arteries:  Patent.  No right identified. Posterior cerebral arteries:  Patent. Basilar artery:  Patent. Vertebral arteries:  Patent. No evidence of high-grade stenosis, large vessel occlusion, or aneurysm unless  noted above. IMPRESSION: 1. No acute intracranial abnormality.  Unremarkable MRI brain. 2. Negative MRA of the head. Electronically Signed   By: Mitzi HansenLance  Furusawa-Stratton M.D.   On: 05/16/2019 19:36   Mr Brain Wo Contrast  Result Date: 05/16/2019 CLINICAL DATA:  58 y/o  F; left-sided numbness. EXAM: MRI HEAD WITHOUT CONTRAST MRA HEAD WITHOUT CONTRAST TECHNIQUE: Multiplanar, multiecho pulse sequences of the brain and surrounding structures were obtained without intravenous contrast. Angiographic images of the head were obtained using MRA technique without contrast. COMPARISON:  05/16/2019 CT head FINDINGS: MRI HEAD FINDINGS Brain: No acute infarction, hemorrhage, hydrocephalus, extra-axial collection or mass lesion. No structural or signal abnormality of the brain identified. Vascular: Normal flow voids. Skull and upper cervical spine: Normal marrow signal. Sinuses/Orbits: Negative. Other: None. MRA HEAD FINDINGS Internal carotid arteries:  Patent. Anterior cerebral arteries:  Patent. Middle cerebral arteries: Patent. Anterior communicating artery: Patent. Posterior communicating arteries:  Patent.  No right identified. Posterior cerebral arteries:  Patent. Basilar artery:  Patent. Vertebral arteries:  Patent. No evidence of high-grade stenosis, large vessel occlusion, or aneurysm unless noted above. IMPRESSION: 1. No acute intracranial abnormality.  Unremarkable MRI brain. 2. Negative MRA of the head. Electronically Signed   By: Mitzi HansenLance  Furusawa-Stratton M.D.   On: 05/16/2019 19:36   Vas [place] Carotid [facility])  Result Date: 05/17/2019 Carotid Arterial Duplex Study Indications:       TIA and left sided weakness. Comparison Study:  No prior. Performing Technologist: Marilynne Halstedita Sturdivant RDMS, RVT  Examination Guidelines: A complete evaluation includes B-mode imaging, spectral Doppler, color Doppler, and power Doppler as needed of all accessible portions of each vessel. Bilateral testing is considered an  integral part of a complete examination. Limited examinations for reoccurring indications may be performed as noted.  Right Carotid Findings: +----------+--------+--------+--------+--------+------------------+           PSV cm/sEDV cm/sStenosisDescribeComments           +----------+--------+--------+--------+--------+------------------+ CCA Prox  106     22                                         +----------+--------+--------+--------+--------+------------------+ CCA Distal118     25                      intimal thickening +----------+--------+--------+--------+--------+------------------+ ICA Prox  72      13      1-39%           intimal thickening +----------+--------+--------+--------+--------+------------------+ ICA Distal85      35                                         +----------+--------+--------+--------+--------+------------------+ ECA       110     24                                         +----------+--------+--------+--------+--------+------------------+ +----------+--------+-------+----------------+-------------------+           PSV cm/sEDV cmsDescribe        Arm Pressure (mmHG) +----------+--------+-------+----------------+-------------------+ Subclavian130            Multiphasic, WNL                    +----------+--------+-------+----------------+-------------------+ +---------+--------+--+--------+--+---------+ VertebralPSV cm/s65EDV cm/s18Antegrade +---------+--------+--+--------+--+---------+  Left Carotid Findings: +----------+--------+--------+--------+--------+------------------+           PSV cm/sEDV cm/sStenosisDescribeComments           +----------+--------+--------+--------+--------+------------------+ CCA Prox  135     35                                         +----------+--------+--------+--------+--------+------------------+ CCA Distal123     29                      intimal thickening  +----------+--------+--------+--------+--------+------------------+ ICA Prox  126     26      1-39%           intimal thickening +----------+--------+--------+--------+--------+------------------+ ICA Distal75      29                                         +----------+--------+--------+--------+--------+------------------+ ECA       128     29                                         +----------+--------+--------+--------+--------+------------------+ +----------+--------+--------+----------------+-------------------+ SubclavianPSV cm/sEDV cm/sDescribe  Arm Pressure (mmHG) +----------+--------+--------+----------------+-------------------+           192             Multiphasic, WNL                    +----------+--------+--------+----------------+-------------------+ +---------+--------+--+--------+--+---------+ VertebralPSV cm/s45EDV cm/s15Antegrade +---------+--------+--+--------+--+---------+  Summary: Right Carotid: Velocities in the right ICA are consistent with a 1-39% stenosis. Left Carotid: Velocities in the left ICA are consistent with a 1-39% stenosis. Vertebrals: Bilateral vertebral arteries demonstrate antegrade flow. *See table(s) above for measurements and observations.     Preliminary     Subjective: Eager to go home  Discharge Exam: Vitals:   05/17/19 1134 05/17/19 1548  BP: 127/74 130/71  Pulse: 71 78  Resp: 15 18  Temp: 98.4 F (36.9 C) 98.4 F (36.9 C)  SpO2: 100% 96%   Vitals:   05/17/19 0558 05/17/19 0750 05/17/19 1134 05/17/19 1548  BP: 122/69 122/78 127/74 130/71  Pulse: 67 (!) 58 71 78  Resp: 18 14 15 18   Temp: 97.9 F (36.6 C) 98.4 F (36.9 C) 98.4 F (36.9 C) 98.4 F (36.9 C)  TempSrc: Oral Oral Oral Oral  SpO2: 99% 100% 100% 96%    General: Pt is alert, awake, not in acute distress Cardiovascular: RRR, S1/S2 +, no rubs, no gallops Respiratory: CTA bilaterally, no wheezing, no rhonchi Abdominal: Soft, NT, ND,  bowel sounds + Extremities: no edema, no cyanosis   The results of significant diagnostics from this hospitalization (including imaging, microbiology, ancillary and laboratory) are listed below for reference.     Microbiology: Recent Results (from the past 240 hour(s))  SARS Coronavirus 2 (CEPHEID - Performed in [facility] hospital lab), Hosp Order     Status: None   Collection Time: 05/16/19  9:25 PM   Specimen: Nasopharyngeal Swab  Result Value Ref Range Status   SARS Coronavirus 2 NEGATIVE NEGATIVE Final    Comment: (NOTE) If result is NEGATIVE SARS-CoV-2 target nucleic acids are NOT DETECTED. The SARS-CoV-2 RNA is generally detectable in upper and lower  respiratory specimens during the acute phase of infection. The lowest  concentration of SARS-CoV-2 viral copies this assay can detect is 250  copies / mL. A negative result does not preclude SARS-CoV-2 infection  and should not be used as the sole basis for treatment or other  patient management decisions.  A negative result may occur with  improper specimen collection / handling, submission of specimen other  than nasopharyngeal swab, presence of viral mutation(s) within the  areas targeted by this assay, and inadequate number of viral copies  (<250 copies / mL). A negative result must be combined with clinical  observations, patient history, and epidemiological information. If result is POSITIVE SARS-CoV-2 target nucleic acids are DETECTED. The SARS-CoV-2 RNA is generally detectable in upper and lower  respiratory specimens dur ing the acute phase of infection.  Positive  results are indicative of active infection with SARS-CoV-2.  Clinical  correlation with patient history and other diagnostic information is  necessary to determine patient infection status.  Positive results do  not rule out bacterial infection or co-infection with other viruses. If result is PRESUMPTIVE POSTIVE SARS-CoV-2 nucleic acids MAY BE PRESENT.    A presumptive positive result was obtained on the submitted specimen  and confirmed on repeat testing.  While 2019 novel coronavirus  (SARS-CoV-2) nucleic acids may be present in the submitted sample  additional confirmatory testing may be necessary for epidemiological  and / or  clinical management purposes  to differentiate between  SARS-CoV-2 and other Sarbecovirus currently known to infect humans.  If clinically indicated additional testing with an alternate test  methodology [phone]([id number]) is advised. The SARS-CoV-2 RNA is generally  detectable in upper and lower respiratory sp ecimens during the acute  phase of infection. The expected result is Negative. Fact Sheet for Patients:  BoilerBrush.com.cyhttps://www.fda.gov/media/136312/download Fact Sheet for Healthcare Providers: https://pope.com/https://www.fda.gov/media/136313/download This test is not yet approved or cleared by the [place] FDA and has been authorized for detection and/or diagnosis of SARS-CoV-2 by FDA under an Emergency Use Authorization (EUA).  This EUA will remain in effect (meaning this test can be used) for the duration of the COVID-19 declaration under Section 564(b)(1) of the Act, 21 U.S.C. section 360bbb-3(b)(1), unless the authorization is terminated or revoked sooner. Performed at [facility], [address] [place], [place] [address]      Labs: BNP (last 3 results) No results for input(s): BNP in the last 8760 hours. Basic Metabolic Panel: Recent Labs  Lab 05/16/19 1330 05/16/19 1346  NA 136 138  K 3.5 3.5  CL 103 105  CO2 22  --   GLUCOSE 151* 155*  BUN 10 11  CREATININE 0.67 0.60  CALCIUM 9.5  --    Liver Function Tests: Recent Labs  Lab 05/16/19 1330  AST 24  ALT 29  ALKPHOS 74  BILITOT 0.8  PROT 7.6  ALBUMIN 4.1   No results for input(s): LIPASE, AMYLASE in the last 168 hours. No results for input(s): AMMONIA in the last 168 hours. CBC: Recent Labs  Lab 05/16/19 1330 05/16/19 1346  WBC 6.2  --    NEUTROABS 3.2  --   HGB 12.9 13.6  HCT 40.3 40.0  MCV 94.4  --   PLT 297  --    Cardiac Enzymes: No results for input(s): CKTOTAL, CKMB, CKMBINDEX, TROPONINI in the last 168 hours. BNP: Invalid input(s): POCBNP CBG: Recent Labs  Lab 05/16/19 1415  GLUCAP 143*   D-Dimer No results for input(s): DDIMER in the last 72 hours. Hgb A1c Recent Labs    05/17/19 0740  HGBA1C 6.6*   Lipid Profile Recent Labs    05/16/19 1330 05/17/19 0740  CHOL 300* 292*  HDL 34* 28*  LDLCALC 219* 811229*  TRIG 236* 177*  CHOLHDL 8.8 10.4   Thyroid function studies No results for input(s): TSH, T4TOTAL, T3FREE, THYROIDAB in the last 72 hours.  Invalid input(s): FREET3 Anemia work up No results for input(s): VITAMINB12, FOLATE, FERRITIN, TIBC, IRON, RETICCTPCT in the last 72 hours. Urinalysis    Component Value Date/Time   COLORURINE YELLOW 01/11/2007 2015   APPEARANCEUR CLEAR 01/11/2007 2015   LABSPEC 1.018 01/11/2007 2015   PHURINE 5.5 01/11/2007 2015   GLUCOSEU NEG mg/dL 91/47/829502/27/2008 62132015   BILIRUBINUR NEG 01/11/2007 2015   KETONESUR NEG mg/dL 08/65/784602/27/2008 96292015   PROTEINUR NEG mg/dL 52/84/132402/27/2008 40102015   UROBILINOGEN 0.2 01/11/2007 2015   NITRITE NEG 01/11/2007 2015   LEUKOCYTESUR NEG 01/11/2007 2015   Sepsis Labs Invalid input(s): PROCALCITONIN,  WBC,  LACTICIDVEN Microbiology Recent Results (from the past 240 hour(s))  SARS Coronavirus 2 (CEPHEID - Performed in [facility] hospital lab), Hosp Order     Status: None   Collection Time: 05/16/19  9:25 PM   Specimen: Nasopharyngeal Swab  Result Value Ref Range Status   SARS Coronavirus 2 NEGATIVE NEGATIVE Final    Comment: (NOTE) If result is NEGATIVE SARS-CoV-2 target nucleic acids are NOT DETECTED.  The SARS-CoV-2 RNA is generally detectable in upper and lower  respiratory specimens during the acute phase of infection. The lowest  concentration of SARS-CoV-2 viral copies this assay can detect is 250  copies / mL. A negative result  does not preclude SARS-CoV-2 infection  and should not be used as the sole basis for treatment or other  patient management decisions.  A negative result may occur with  improper specimen collection / handling, submission of specimen other  than nasopharyngeal swab, presence of viral mutation(s) within the  areas targeted by this assay, and inadequate number of viral copies  (<250 copies / mL). A negative result must be combined with clinical  observations, patient history, and epidemiological information. If result is POSITIVE SARS-CoV-2 target nucleic acids are DETECTED. The SARS-CoV-2 RNA is generally detectable in upper and lower  respiratory specimens dur ing the acute phase of infection.  Positive  results are indicative of active infection with SARS-CoV-2.  Clinical  correlation with patient history and other diagnostic information is  necessary to determine patient infection status.  Positive results do  not rule out bacterial infection or co-infection with other viruses. If result is PRESUMPTIVE POSTIVE SARS-CoV-2 nucleic acids MAY BE PRESENT.   A presumptive positive result was obtained on the submitted specimen  and confirmed on repeat testing.  While 2019 novel coronavirus  (SARS-CoV-2) nucleic acids may be present in the submitted sample  additional confirmatory testing may be necessary for epidemiological  and / or clinical management purposes  to differentiate between  SARS-CoV-2 and other Sarbecovirus currently known to infect humans.  If clinically indicated additional testing with an alternate test  methodology [phone]) is advised. The SARS-CoV-2 RNA is generally  detectable in upper and lower respiratory sp ecimens during the acute  phase of infection. The expected result is Negative. Fact Sheet for Patients:  BoilerBrush.com.cy Fact Sheet for Healthcare Providers: https://pope.com/ This test is not yet approved or  cleared by the [place] FDA and has been authorized for detection and/or diagnosis of SARS-CoV-2 by FDA under an Emergency Use Authorization (EUA).  This EUA will remain in effect (meaning this test can be used) for the duration of the COVID-19 declaration under Section 564(b)(1) of the Act, 21 U.S.C. section 360bbb-3(b)(1), unless the authorization is terminated or revoked sooner. Performed at [facility], [address] [place], [place] [address]    Time spent:  SIGNED:   Rickey Barbara, MD  [facility] 05/17/2019, 6:24 PM  If 7PM-7AM, please contact night-coverage

## 2019-05-17 NOTE — Progress Notes (Signed)
STROKE TEAM PROGRESS NOTE   INTERVAL HISTORY Pt sitting in bed, eating yogurt. She stated that she still has mild left face numbness, but otherwise largely resolved. She did have HA yesterday shortly after facial and left arm numbness. Today HA is gone. She felt better but still has mild left facial numbness. She has a lot of stress that her office is small but shared with 6 people, she really afraid to catch COVID-19.   Vitals:   05/17/19 0352 05/17/19 0558 05/17/19 0750 05/17/19 1134  BP: 114/67 122/69 122/78 127/74  Pulse: 71 67 (!) 58 71  Resp: 18 18 14 15   Temp: 97.9 F (36.6 C) 97.9 F (36.6 C) 98.4 F (36.9 C) 98.4 F (36.9 C)  TempSrc: Oral Oral Oral Oral  SpO2: 94% 99% 100% 100%    CBC:  Recent Labs  Lab 05/16/19 1330 05/16/19 1346  WBC 6.2  --   NEUTROABS 3.2  --   HGB 12.9 13.6  HCT 40.3 40.0  MCV 94.4  --   PLT 297  --     Basic Metabolic Panel:  Recent Labs  Lab 05/16/19 1330 05/16/19 1346  NA 136 138  K 3.5 3.5  CL 103 105  CO2 22  --   GLUCOSE 151* 155*  BUN 10 11  CREATININE 0.67 0.60  CALCIUM 9.5  --    Lipid Panel:     Component Value Date/Time   CHOL 292 (H) 05/17/2019 0740   TRIG 177 (H) 05/17/2019 0740   HDL 28 (L) 05/17/2019 0740   CHOLHDL 10.4 05/17/2019 0740   VLDL 35 05/17/2019 0740   LDLCALC 229 (H) 05/17/2019 0740   HgbA1c:  Lab Results  Component Value Date   HGBA1C 6.6 (H) 05/17/2019   Urine Drug Screen: No results found for: LABOPIA, COCAINSCRNUR, LABBENZ, AMPHETMU, THCU, LABBARB  Alcohol Level No results found for: ETH  IMAGING Ct Head Wo Contrast  Result Date: 05/16/2019 CLINICAL DATA:  Left facial, eye, and shoulder numbness, possible stroke EXAM: CT HEAD WITHOUT CONTRAST TECHNIQUE: Contiguous axial images were obtained from the base of the skull through the vertex without intravenous contrast. COMPARISON:  02/26/2013 FINDINGS: Brain: No evidence of acute infarction, hemorrhage, hydrocephalus, extra-axial collection or  mass lesion/mass effect. Vascular: No hyperdense vessel or unexpected calcification. Skull: Normal. Negative for fracture or focal lesion. Sinuses/Orbits: No acute finding. Other: None. IMPRESSION: No acute intracranial pathology. Electronically Signed   By: Eddie Candle M.D.   On: 05/16/2019 14:12   Mr Angio Head Wo Contrast  Result Date: 05/16/2019 CLINICAL DATA:  58 y/o  F; left-sided numbness. EXAM: MRI HEAD WITHOUT CONTRAST MRA HEAD WITHOUT CONTRAST TECHNIQUE: Multiplanar, multiecho pulse sequences of the brain and surrounding structures were obtained without intravenous contrast. Angiographic images of the head were obtained using MRA technique without contrast. COMPARISON:  05/16/2019 CT head FINDINGS: MRI HEAD FINDINGS Brain: No acute infarction, hemorrhage, hydrocephalus, extra-axial collection or mass lesion. No structural or signal abnormality of the brain identified. Vascular: Normal flow voids. Skull and upper cervical spine: Normal marrow signal. Sinuses/Orbits: Negative. Other: None. MRA HEAD FINDINGS Internal carotid arteries:  Patent. Anterior cerebral arteries:  Patent. Middle cerebral arteries: Patent. Anterior communicating artery: Patent. Posterior communicating arteries:  Patent.  No right identified. Posterior cerebral arteries:  Patent. Basilar artery:  Patent. Vertebral arteries:  Patent. No evidence of high-grade stenosis, large vessel occlusion, or aneurysm unless noted above. IMPRESSION: 1. No acute intracranial abnormality.  Unremarkable MRI brain. 2. Negative MRA of the  head. Electronically Signed   By: Mitzi HansenLance  Furusawa-Stratton M.D.   On: 05/16/2019 19:36   Mr Brain Wo Contrast  Result Date: 05/16/2019 CLINICAL DATA:  58 y/o  F; left-sided numbness. EXAM: MRI HEAD WITHOUT CONTRAST MRA HEAD WITHOUT CONTRAST TECHNIQUE: Multiplanar, multiecho pulse sequences of the brain and surrounding structures were obtained without intravenous contrast. Angiographic images of the head were  obtained using MRA technique without contrast. COMPARISON:  05/16/2019 CT head FINDINGS: MRI HEAD FINDINGS Brain: No acute infarction, hemorrhage, hydrocephalus, extra-axial collection or mass lesion. No structural or signal abnormality of the brain identified. Vascular: Normal flow voids. Skull and upper cervical spine: Normal marrow signal. Sinuses/Orbits: Negative. Other: None. MRA HEAD FINDINGS Internal carotid arteries:  Patent. Anterior cerebral arteries:  Patent. Middle cerebral arteries: Patent. Anterior communicating artery: Patent. Posterior communicating arteries:  Patent.  No right identified. Posterior cerebral arteries:  Patent. Basilar artery:  Patent. Vertebral arteries:  Patent. No evidence of high-grade stenosis, large vessel occlusion, or aneurysm unless noted above. IMPRESSION: 1. No acute intracranial abnormality.  Unremarkable MRI brain. 2. Negative MRA of the head. Electronically Signed   By: Mitzi HansenLance  Furusawa-Stratton M.D.   On: 05/16/2019 19:36    PHYSICAL EXAM  Temp:  [97.9 F (36.6 C)-98.4 F (36.9 C)] 98.4 F (36.9 C) (07/02 1134) Pulse Rate:  [58-85] 71 (07/02 1134) Resp:  [14-27] 15 (07/02 1134) BP: (114-169)/(67-108) 127/74 (07/02 1134) SpO2:  [92 %-100 %] 100 % (07/02 1134)  General - Well nourished, well developed, in no apparent distress.  Ophthalmologic - fundi not visualized due to noncooperation.  Cardiovascular - Regular rate and rhythm.  Mental Status -  Level of arousal and orientation to time, place, and person were intact. Language including expression, naming, repetition, comprehension was assessed and found intact. Attention span and concentration were normal. Fund of Knowledge was assessed and was intact.  Cranial Nerves II - XII - II - Visual field intact OU. III, IV, VI - Extraocular movements intact. V - Facial sensation mildly decreased on the left. VII - Facial movement intact bilaterally. VIII - Hearing & vestibular intact  bilaterally. X - Palate elevates symmetrically. XI - Chin turning & shoulder shrug intact bilaterally. XII - Tongue protrusion intact.  Motor Strength - The patient's strength was normal in all extremities and pronator drift was absent.  Bulk was normal and fasciculations were absent.   Motor Tone - Muscle tone was assessed at the neck and appendages and was normal.  Reflexes - The patient's reflexes were symmetrical in all extremities and she had no pathological reflexes.  Sensory - Light touch, temperature/pinprick were assessed and were symmetrical.    Coordination - The patient had normal movements in the hands with no ataxia or dysmetria.  Tremor was absent.  Gait and Station - deferred.   ASSESSMENT/PLAN Yesenia Salas is a 58 y.o. female with history of hypothyroidism status post thyroidectomy, cervical dysplasia, obesity presenting with 1 day history left-sided weakness and numbness with uncontrolled blood pressure.   Likely complicated migraine along with anxiety / stress  CT head no acute abnormality  MRI unremarkable  MRA unremarkable  Carotid Doppler unremarkable  2D Echo EF 60 to 65%.  No SOE  LDL 229  HgbA1c 6.6  Lovenox 40 mg subcu daily for VTE prophylaxis  No antithrombotic prior to admission, now on aspirin 81 mg daily. Continue aspirin 81 mg daily at discharge for stroke prevention   Therapy recommendations: No therapy needs  Disposition: Return home  Blood pressure  No history of hypertension   Blood pressure elevated on arrival 169/108   Stable today . BP goal normotensive  Hyperlipidemia  Home meds:  No statin  Now on Lipitor 80  LDL 229, goal < 70  Continue statin at discharge  Diabetes type II   New diagnosis   HgbA1c 6.6, at goal < 7.0  SSI  CBG monitoring  DM diet and education provided  Other Stroke Risk Factors  Obesity, recommend weight loss, diet and exercise as appropriate   Other Active  Problems  Hypothyroidism s/p thyroidectomy  Anxiety - her office is small but shared with 6 people, she really afraid to catch COVID-19.  Hospital day # 0  Neurology will sign off. Please call with questions. [facility]. Thanks for the consult.  Marvel PlanJindong Tanner Yeley, MD PhD Stroke Neurology 05/17/2019 3:36 PM    To contact Stroke Continuity provider, please refer to [email or web address]. After hours, contact General Neurology

## 2019-06-27 ENCOUNTER — Inpatient Hospital Stay: Payer: Medicaid Other | Admitting: Adult Health

## 2019-07-10 DIAGNOSIS — Z418 Encounter for other procedures for purposes other than remedying health state: Secondary | ICD-10-CM | POA: Diagnosis not present

## 2019-07-10 DIAGNOSIS — R7303 Prediabetes: Secondary | ICD-10-CM | POA: Diagnosis not present

## 2019-07-10 DIAGNOSIS — E7849 Other hyperlipidemia: Secondary | ICD-10-CM | POA: Diagnosis not present

## 2019-07-10 DIAGNOSIS — E039 Hypothyroidism, unspecified: Secondary | ICD-10-CM | POA: Diagnosis not present

## 2019-07-10 DIAGNOSIS — Z119 Encounter for screening for infectious and parasitic diseases, unspecified: Secondary | ICD-10-CM | POA: Diagnosis not present

## 2019-07-23 ENCOUNTER — Other Ambulatory Visit: Payer: Self-pay

## 2019-07-23 ENCOUNTER — Emergency Department (HOSPITAL_COMMUNITY)
Admission: EM | Admit: 2019-07-23 | Discharge: 2019-07-23 | Disposition: A | Discharge status: Home / Self Care | Payer: Medicaid Other | Attending: Emergency Medicine | Admitting: Emergency Medicine

## 2019-07-23 DIAGNOSIS — Z8673 Personal history of transient ischemic attack (TIA), and cerebral infarction without residual deficits: Secondary | ICD-10-CM | POA: Insufficient documentation

## 2019-07-23 DIAGNOSIS — R070 Pain in throat: Secondary | ICD-10-CM | POA: Insufficient documentation

## 2019-07-23 DIAGNOSIS — J020 Streptococcal pharyngitis: Secondary | ICD-10-CM | POA: Diagnosis not present

## 2019-07-23 DIAGNOSIS — J029 Acute pharyngitis, unspecified: Secondary | ICD-10-CM

## 2019-07-23 DIAGNOSIS — Z79899 Other long term (current) drug therapy: Secondary | ICD-10-CM | POA: Insufficient documentation

## 2019-07-23 DIAGNOSIS — E039 Hypothyroidism, unspecified: Secondary | ICD-10-CM | POA: Insufficient documentation

## 2019-07-23 DIAGNOSIS — Z20828 Contact with and (suspected) exposure to other viral communicable diseases: Secondary | ICD-10-CM | POA: Diagnosis not present

## 2019-07-23 LAB — GROUP A STREP BY PCR: Group A Strep by PCR: NOT DETECTED

## 2019-07-23 NOTE — ED Triage Notes (Signed)
Pt reports a sore throat for 7 days. Denies fever, cough, SOB, n/v.

## 2019-07-23 NOTE — ED Provider Notes (Signed)
[facility] EMERGENCY DEPARTMENT Provider Note   CSN: [id number] Arrival date & time: 07/23/19  1714     History   Chief Complaint Chief Complaint  Patient presents with  . Sore Throat    HPI Nicolas Kymia Simi is a 58 y.o. female.     HPI   58 year old female presents today with complaints of sore throat.  She notes 7-day history of sore throat.  She notes no significant cough, shortness of breath nausea vomiting or fever.  No medications prior to arrival.  Patient notes a history of hypothyroidism otherwise no other chronic health conditions.  Patient is here as her work requires covert testing to return.  She denies any close sick contacts but notes that her employment wanted them tested for unknown reasons.  She notes no history of significant acid reflux.   Past Medical History:  Diagnosis Date  . Hypothyroidism, postsurgical     Patient Active Problem List   Diagnosis Date Noted  . Migraine 05/17/2019  . TIA (transient ischemic attack) 05/16/2019  . Blood pressure elevated without history of HTN 05/16/2019  . Hyperglycemia 05/16/2019  . Severe cervical dysplasia, histologically confirmed 10/03/2018  . ASCUS with positive high risk HPV cervical 09/06/2018  . Hypothyroidism 10/17/2007    Past Surgical History:  Procedure Laterality Date  . THYROIDECTOMY  02-2004     OB History    Gravida  3   Para  2   Term  2   Preterm      AB  1   Living  2     SAB      TAB      Ectopic      Multiple      Live Births               Home Medications    Prior to Admission medications   Medication Sig Start Date End Date Taking? Authorizing Provider  atorvastatin (LIPITOR) 80 MG tablet Take 1 tablet (80 mg total) by mouth daily at 6 PM. 05/17/19 06/16/19  Donne Hazel, MD  Levothyroxine Sodium 125 MCG CAPS Take 112 mcg by mouth daily before breakfast.     [provider]    Family History Family History  Problem  Relation Age of Onset  . Hypertension Mother     Social History Social History   Tobacco Use  . Smoking status: Never Smoker  . Smokeless tobacco: Never Used  Substance Use Topics  . Alcohol use: No    Comment: Patient drinks 1-2 cups of coffee a day.  . Drug use: No     Allergies   Patient has no known allergies.   Review of Systems Review of Systems  All other systems reviewed and are negative.    Physical Exam Updated Vital Signs BP (!) 151/91 (BP Location: Left Arm)   Pulse 83   Temp 98.4 F (36.9 C) (Oral)   Resp 18   SpO2 99%   Physical Exam Vitals signs and nursing note reviewed.  Constitutional:      Appearance: She is well-developed.  HENT:     Head: Normocephalic and atraumatic.     Comments: Very minimal erythema to the oropharynx no tonsillar swelling or exudate, no tender lymphadenopathy Eyes:     General: No scleral icterus.       Right eye: No discharge.        Left eye: No discharge.     Conjunctiva/sclera: Conjunctivae normal.  Pupils: Pupils are equal, round, and reactive to light.  Neck:     Musculoskeletal: Normal range of motion.     Vascular: No JVD.     Trachea: No tracheal deviation.  Pulmonary:     Effort: Pulmonary effort is normal. No respiratory distress.     Breath sounds: Normal breath sounds. No stridor. No wheezing or rales.  Neurological:     Mental Status: She is alert and oriented to person, place, and time.     Coordination: Coordination normal.  Psychiatric:        Behavior: Behavior normal.        Thought Content: Thought content normal.        Judgment: Judgment normal.      ED Treatments / Results  Labs (all labs ordered are listed, but only abnormal results are displayed) Labs Reviewed  GROUP A STREP BY PCR  NOVEL CORONAVIRUS, NAA (HOSP ORDER, SEND-OUT TO REF LAB; TAT 18-24 HRS)    EKG None  Radiology No results found.  Procedures Procedures (including critical care time)  Medications  Ordered in ED Medications - No data to display   Initial Impression / Assessment and Plan / ED Course  I have reviewed the triage vital signs and the nursing notes.  Pertinent labs & imaging results that were available during my care of the patient were reviewed by me and considered in my medical decision making (see chart for details).        58 year old female presents today with sore throat.  We will test her for strep, if it is negative she will have Cobra testing.  No signs of significant lower respiratory tract infection or life-threatening etiology.  Patient discharged with strict return precautions and symptomatic care.  She verbalized understanding and agreement to today's plan.  Theia Thornell Muledeleyeh Dubois was evaluated in Emergency Department on 07/23/2019 for the symptoms described in the history of present illness. She was evaluated in the context of the global COVID-19 pandemic, which necessitated consideration that the patient might be at risk for infection with the SARS-CoV-2 virus that causes COVID-19. Institutional protocols and algorithms that pertain to the evaluation of patients at risk for COVID-19 are in a state of rapid change based on information released by regulatory bodies including the CDC and federal and state organizations. These policies and algorithms were followed during the patient's care in the ED.   Final Clinical Impressions(s) / ED Diagnoses   Final diagnoses:  Sore throat    ED Discharge Orders    None       Rosalio LoudHedges, Dalyn Kjos, PA-C 07/23/19 2129    Geoffery Lyonselo, Douglas, MD 07/23/19 [phone]

## 2019-07-23 NOTE — Discharge Instructions (Signed)
Please read attached information. If you experience any new or worsening signs or symptoms please return to the emergency room for evaluation. Please follow-up with your primary care provider or specialist as discussed.  °

## 2019-07-24 LAB — NOVEL CORONAVIRUS, NAA (HOSP ORDER, SEND-OUT TO REF LAB; TAT 18-24 HRS): SARS-CoV-2, NAA: NOT DETECTED

## 2019-08-31 DIAGNOSIS — Z1159 Encounter for screening for other viral diseases: Secondary | ICD-10-CM | POA: Diagnosis not present

## 2019-08-31 DIAGNOSIS — Z20828 Contact with and (suspected) exposure to other viral communicable diseases: Secondary | ICD-10-CM | POA: Diagnosis not present

## 2019-09-12 DIAGNOSIS — E559 Vitamin D deficiency, unspecified: Secondary | ICD-10-CM | POA: Diagnosis not present

## 2019-09-12 DIAGNOSIS — E7849 Other hyperlipidemia: Secondary | ICD-10-CM | POA: Diagnosis not present

## 2019-09-12 DIAGNOSIS — K219 Gastro-esophageal reflux disease without esophagitis: Secondary | ICD-10-CM | POA: Diagnosis not present

## 2019-09-12 DIAGNOSIS — R7303 Prediabetes: Secondary | ICD-10-CM | POA: Diagnosis not present

## 2019-09-12 DIAGNOSIS — E039 Hypothyroidism, unspecified: Secondary | ICD-10-CM | POA: Diagnosis not present

## 2019-09-12 DIAGNOSIS — J302 Other seasonal allergic rhinitis: Secondary | ICD-10-CM | POA: Diagnosis not present

## 2019-09-12 DIAGNOSIS — Z0000 Encounter for general adult medical examination without abnormal findings: Secondary | ICD-10-CM | POA: Diagnosis not present

## 2019-12-26 DIAGNOSIS — E7849 Other hyperlipidemia: Secondary | ICD-10-CM | POA: Diagnosis not present

## 2019-12-26 DIAGNOSIS — E039 Hypothyroidism, unspecified: Secondary | ICD-10-CM | POA: Diagnosis not present

## 2019-12-26 DIAGNOSIS — M542 Cervicalgia: Secondary | ICD-10-CM | POA: Diagnosis not present

## 2019-12-26 DIAGNOSIS — R7303 Prediabetes: Secondary | ICD-10-CM | POA: Diagnosis not present

## 2020-03-13 DIAGNOSIS — Z23 Encounter for immunization: Secondary | ICD-10-CM | POA: Diagnosis not present

## 2020-04-03 DIAGNOSIS — Z23 Encounter for immunization: Secondary | ICD-10-CM | POA: Diagnosis not present

## 2020-04-08 DIAGNOSIS — Z23 Encounter for immunization: Secondary | ICD-10-CM | POA: Diagnosis not present

## 2020-05-15 DIAGNOSIS — Z419 Encounter for procedure for purposes other than remedying health state, unspecified: Secondary | ICD-10-CM | POA: Diagnosis not present

## 2020-06-15 DIAGNOSIS — Z419 Encounter for procedure for purposes other than remedying health state, unspecified: Secondary | ICD-10-CM | POA: Diagnosis not present

## 2020-07-16 DIAGNOSIS — Z419 Encounter for procedure for purposes other than remedying health state, unspecified: Secondary | ICD-10-CM | POA: Diagnosis not present

## 2020-08-15 DIAGNOSIS — Z419 Encounter for procedure for purposes other than remedying health state, unspecified: Secondary | ICD-10-CM | POA: Diagnosis not present

## 2020-09-15 DIAGNOSIS — Z419 Encounter for procedure for purposes other than remedying health state, unspecified: Secondary | ICD-10-CM | POA: Diagnosis not present

## 2020-10-15 DIAGNOSIS — Z419 Encounter for procedure for purposes other than remedying health state, unspecified: Secondary | ICD-10-CM | POA: Diagnosis not present

## 2020-11-15 DIAGNOSIS — Z419 Encounter for procedure for purposes other than remedying health state, unspecified: Secondary | ICD-10-CM | POA: Diagnosis not present

## 2020-12-16 DIAGNOSIS — Z419 Encounter for procedure for purposes other than remedying health state, unspecified: Secondary | ICD-10-CM | POA: Diagnosis not present

## 2021-01-13 DIAGNOSIS — Z419 Encounter for procedure for purposes other than remedying health state, unspecified: Secondary | ICD-10-CM | POA: Diagnosis not present

## 2021-02-13 DIAGNOSIS — Z419 Encounter for procedure for purposes other than remedying health state, unspecified: Secondary | ICD-10-CM | POA: Diagnosis not present

## 2021-03-15 DIAGNOSIS — Z419 Encounter for procedure for purposes other than remedying health state, unspecified: Secondary | ICD-10-CM | POA: Diagnosis not present

## 2021-04-02 ENCOUNTER — Other Ambulatory Visit: Payer: Self-pay | Admitting: Internal Medicine

## 2021-04-02 DIAGNOSIS — E039 Hypothyroidism, unspecified: Secondary | ICD-10-CM | POA: Diagnosis not present

## 2021-04-02 DIAGNOSIS — R7303 Prediabetes: Secondary | ICD-10-CM | POA: Diagnosis not present

## 2021-04-02 DIAGNOSIS — Z0000 Encounter for general adult medical examination without abnormal findings: Secondary | ICD-10-CM | POA: Diagnosis not present

## 2021-04-02 DIAGNOSIS — E559 Vitamin D deficiency, unspecified: Secondary | ICD-10-CM | POA: Diagnosis not present

## 2021-04-02 DIAGNOSIS — Z1239 Encounter for other screening for malignant neoplasm of breast: Secondary | ICD-10-CM | POA: Diagnosis not present

## 2021-04-02 DIAGNOSIS — E7849 Other hyperlipidemia: Secondary | ICD-10-CM | POA: Diagnosis not present

## 2021-04-02 DIAGNOSIS — E2839 Other primary ovarian failure: Secondary | ICD-10-CM | POA: Diagnosis not present

## 2021-04-02 DIAGNOSIS — Z1211 Encounter for screening for malignant neoplasm of colon: Secondary | ICD-10-CM | POA: Diagnosis not present

## 2021-04-03 LAB — COMPLETE METABOLIC PANEL WITH GFR
AG Ratio: 1.3 (calc) (ref 1.0–2.5)
ALT: 16 U/L (ref 6–29)
AST: 14 U/L (ref 10–35)
Albumin: 4.3 g/dL (ref 3.6–5.1)
Alkaline phosphatase (APISO): 91 U/L (ref 37–153)
BUN: 9 mg/dL (ref 7–25)
CO2: 24 mmol/L (ref 20–32)
Calcium: 10 mg/dL (ref 8.6–10.4)
Chloride: 101 mmol/L (ref 98–110)
Creat: 0.79 mg/dL (ref 0.50–1.05)
GFR, Est African American: 96 mL/min/{1.73_m2} (ref >=60)
GFR, Est Non African American: 83 mL/min/{1.73_m2} (ref >=60)
Globulin: 3.3 g/dL (calc) (ref 1.9–3.7)
Glucose, Bld: 89 mg/dL (ref 65–99)
Potassium: 4 mmol/L (ref 3.5–5.3)
Sodium: 139 mmol/L (ref 135–146)
Total Bilirubin: 0.3 mg/dL (ref 0.2–1.2)
Total Protein: 7.6 g/dL (ref 6.1–8.1)

## 2021-04-03 LAB — LIPID PANEL
Cholesterol: 302 mg/dL — ABNORMAL HIGH (ref <200)
HDL: 31 mg/dL — ABNORMAL LOW (ref >=50)
Non-HDL Cholesterol (Calc): 271 mg/dL (calc) — ABNORMAL HIGH (ref <130)
Total CHOL/HDL Ratio: 9.7 (calc) — ABNORMAL HIGH (ref <5.0)
Triglycerides: 444 mg/dL — ABNORMAL HIGH (ref <150)

## 2021-04-03 LAB — CBC
HCT: 38.9 % (ref 35.0–45.0)
Hemoglobin: 13 g/dL (ref 11.7–15.5)
MCH: 30.9 pg (ref 27.0–33.0)
MCHC: 33.4 g/dL (ref 32.0–36.0)
MCV: 92.4 fL (ref 80.0–100.0)
MPV: 10.8 fL (ref 7.5–12.5)
Platelets: 286 10*3/uL (ref 140–400)
RBC: 4.21 10*6/uL (ref 3.80–5.10)
RDW: 12.9 % (ref 11.0–15.0)
WBC: 6.1 10*3/uL (ref 3.8–10.8)

## 2021-04-03 LAB — TSH: TSH: 0.33 mIU/L — ABNORMAL LOW (ref 0.40–4.50)

## 2021-04-03 LAB — VITAMIN D 25 HYDROXY (VIT D DEFICIENCY, FRACTURES): Vit D, 25-Hydroxy: 45 ng/mL (ref 30–100)

## 2021-04-03 LAB — T4, FREE: Free T4: 1.6 ng/dL (ref 0.8–1.8)

## 2021-04-07 ENCOUNTER — Other Ambulatory Visit: Payer: Self-pay | Admitting: Internal Medicine

## 2021-04-07 DIAGNOSIS — E2839 Other primary ovarian failure: Secondary | ICD-10-CM

## 2021-04-15 DIAGNOSIS — Z419 Encounter for procedure for purposes other than remedying health state, unspecified: Secondary | ICD-10-CM | POA: Diagnosis not present

## 2021-04-17 ENCOUNTER — Other Ambulatory Visit: Payer: Self-pay | Admitting: Internal Medicine

## 2021-04-17 DIAGNOSIS — Z1231 Encounter for screening mammogram for malignant neoplasm of breast: Secondary | ICD-10-CM

## 2021-05-15 DIAGNOSIS — Z419 Encounter for procedure for purposes other than remedying health state, unspecified: Secondary | ICD-10-CM | POA: Diagnosis not present

## 2021-06-15 DIAGNOSIS — Z419 Encounter for procedure for purposes other than remedying health state, unspecified: Secondary | ICD-10-CM | POA: Diagnosis not present

## 2021-07-10 DIAGNOSIS — K219 Gastro-esophageal reflux disease without esophagitis: Secondary | ICD-10-CM | POA: Diagnosis not present

## 2021-07-10 DIAGNOSIS — R7303 Prediabetes: Secondary | ICD-10-CM | POA: Diagnosis not present

## 2021-07-10 DIAGNOSIS — E039 Hypothyroidism, unspecified: Secondary | ICD-10-CM | POA: Diagnosis not present

## 2021-07-10 DIAGNOSIS — E7849 Other hyperlipidemia: Secondary | ICD-10-CM | POA: Diagnosis not present

## 2021-07-16 DIAGNOSIS — Z419 Encounter for procedure for purposes other than remedying health state, unspecified: Secondary | ICD-10-CM | POA: Diagnosis not present

## 2021-08-15 DIAGNOSIS — Z419 Encounter for procedure for purposes other than remedying health state, unspecified: Secondary | ICD-10-CM | POA: Diagnosis not present

## 2021-09-15 DIAGNOSIS — Z419 Encounter for procedure for purposes other than remedying health state, unspecified: Secondary | ICD-10-CM | POA: Diagnosis not present

## 2021-10-15 ENCOUNTER — Other Ambulatory Visit: Payer: Medicaid Other

## 2021-10-15 ENCOUNTER — Ambulatory Visit
Admission: RE | Admit: 2021-10-15 | Discharge: 2021-10-15 | Disposition: A | Discharge status: Home / Self Care | Payer: Medicaid Other | Source: Ambulatory Visit | Attending: Internal Medicine | Admitting: Internal Medicine

## 2021-10-15 ENCOUNTER — Other Ambulatory Visit: Payer: Self-pay

## 2021-10-15 DIAGNOSIS — Z1231 Encounter for screening mammogram for malignant neoplasm of breast: Secondary | ICD-10-CM | POA: Diagnosis not present

## 2021-10-15 DIAGNOSIS — E2839 Other primary ovarian failure: Secondary | ICD-10-CM

## 2021-10-15 DIAGNOSIS — Z419 Encounter for procedure for purposes other than remedying health state, unspecified: Secondary | ICD-10-CM | POA: Diagnosis not present

## 2021-10-16 ENCOUNTER — Other Ambulatory Visit: Payer: Self-pay | Admitting: Internal Medicine

## 2021-10-16 DIAGNOSIS — R928 Other abnormal and inconclusive findings on diagnostic imaging of breast: Secondary | ICD-10-CM

## 2021-11-15 DIAGNOSIS — Z419 Encounter for procedure for purposes other than remedying health state, unspecified: Secondary | ICD-10-CM | POA: Diagnosis not present

## 2021-11-25 ENCOUNTER — Ambulatory Visit
Admission: RE | Admit: 2021-11-25 | Discharge: 2021-11-25 | Disposition: A | Discharge status: Home / Self Care | Payer: Medicaid Other | Source: Ambulatory Visit | Attending: Internal Medicine | Admitting: Internal Medicine

## 2021-11-25 ENCOUNTER — Other Ambulatory Visit: Payer: Self-pay

## 2021-11-25 DIAGNOSIS — R928 Other abnormal and inconclusive findings on diagnostic imaging of breast: Secondary | ICD-10-CM

## 2021-12-16 DIAGNOSIS — Z419 Encounter for procedure for purposes other than remedying health state, unspecified: Secondary | ICD-10-CM | POA: Diagnosis not present

## 2022-01-13 DIAGNOSIS — Z419 Encounter for procedure for purposes other than remedying health state, unspecified: Secondary | ICD-10-CM | POA: Diagnosis not present

## 2022-02-13 DIAGNOSIS — Z419 Encounter for procedure for purposes other than remedying health state, unspecified: Secondary | ICD-10-CM | POA: Diagnosis not present

## 2022-03-15 DIAGNOSIS — Z419 Encounter for procedure for purposes other than remedying health state, unspecified: Secondary | ICD-10-CM | POA: Diagnosis not present

## 2022-04-15 DIAGNOSIS — Z419 Encounter for procedure for purposes other than remedying health state, unspecified: Secondary | ICD-10-CM | POA: Diagnosis not present

## 2022-05-03 DIAGNOSIS — R7303 Prediabetes: Secondary | ICD-10-CM | POA: Diagnosis not present

## 2022-05-03 DIAGNOSIS — E039 Hypothyroidism, unspecified: Secondary | ICD-10-CM | POA: Diagnosis not present

## 2022-05-03 DIAGNOSIS — M542 Cervicalgia: Secondary | ICD-10-CM | POA: Diagnosis not present

## 2022-05-03 DIAGNOSIS — Z1239 Encounter for other screening for malignant neoplasm of breast: Secondary | ICD-10-CM | POA: Diagnosis not present

## 2022-05-03 DIAGNOSIS — K219 Gastro-esophageal reflux disease without esophagitis: Secondary | ICD-10-CM | POA: Diagnosis not present

## 2022-05-03 DIAGNOSIS — E7849 Other hyperlipidemia: Secondary | ICD-10-CM | POA: Diagnosis not present

## 2022-05-03 DIAGNOSIS — Z0000 Encounter for general adult medical examination without abnormal findings: Secondary | ICD-10-CM | POA: Diagnosis not present

## 2022-05-11 DIAGNOSIS — M4712 Other spondylosis with myelopathy, cervical region: Secondary | ICD-10-CM | POA: Diagnosis not present

## 2022-05-15 DIAGNOSIS — Z419 Encounter for procedure for purposes other than remedying health state, unspecified: Secondary | ICD-10-CM | POA: Diagnosis not present

## 2022-05-16 ENCOUNTER — Other Ambulatory Visit: Payer: Self-pay

## 2022-05-16 DIAGNOSIS — S46811A Strain of other muscles, fascia and tendons at shoulder and upper arm level, right arm, initial encounter: Secondary | ICD-10-CM | POA: Diagnosis not present

## 2022-05-16 DIAGNOSIS — S46911A Strain of unspecified muscle, fascia and tendon at shoulder and upper arm level, right arm, initial encounter: Secondary | ICD-10-CM | POA: Insufficient documentation

## 2022-05-16 DIAGNOSIS — S4991XA Unspecified injury of right shoulder and upper arm, initial encounter: Secondary | ICD-10-CM | POA: Diagnosis present

## 2022-05-16 DIAGNOSIS — X58XXXA Exposure to other specified factors, initial encounter: Secondary | ICD-10-CM | POA: Diagnosis not present

## 2022-05-17 ENCOUNTER — Emergency Department (HOSPITAL_BASED_OUTPATIENT_CLINIC_OR_DEPARTMENT_OTHER)
Admission: EM | Admit: 2022-05-17 | Discharge: 2022-05-17 | Disposition: A | Discharge status: Home / Self Care | Payer: Medicaid Other | Attending: Emergency Medicine | Admitting: Emergency Medicine

## 2022-05-17 ENCOUNTER — Encounter (HOSPITAL_BASED_OUTPATIENT_CLINIC_OR_DEPARTMENT_OTHER): Payer: Self-pay

## 2022-05-17 DIAGNOSIS — S46811A Strain of other muscles, fascia and tendons at shoulder and upper arm level, right arm, initial encounter: Secondary | ICD-10-CM | POA: Diagnosis not present

## 2022-05-17 MED ORDER — ACETAMINOPHEN 500 MG PO TABS
1000.0000 mg | ORAL_TABLET | Freq: Once | ORAL | Status: AC
  Administered 2022-05-17: 1000 mg via ORAL
  Filled 2022-05-17: qty 2

## 2022-05-17 MED ORDER — KETOROLAC TROMETHAMINE 15 MG/ML IJ SOLN
15.0000 mg | Freq: Once | INTRAMUSCULAR | Status: AC
  Administered 2022-05-17: 15 mg via INTRAMUSCULAR
  Filled 2022-05-17: qty 1

## 2022-05-17 NOTE — ED Provider Notes (Signed)
[facility] EMERGENCY DEPT Provider Note   CSN: [id number] Arrival date & time: 05/16/22  2359     History  Chief Complaint  Patient presents with   Neck Pain    Yesenia Salas is a 61 y.o. female.  61 yo F with a chief complaints of right-sided neck pain.  This been going on for about 3 and half weeks now.  No obvious injury worse with certain movements palpation and twisting.  Is seen her family doctor multiple times for this.  Has been tried on different medications without significant improvement.  Feels like the pain is moving down her arm now.   Neck Pain      Home Medications Prior to Admission medications   Medication Sig Start Date End Date Taking? Authorizing Provider  atorvastatin (LIPITOR) 80 MG tablet Take 1 tablet (80 mg total) by mouth daily at 6 PM. 05/17/19 06/16/19  Jerald Kief, MD  Levothyroxine Sodium 125 MCG CAPS Take 112 mcg by mouth daily before breakfast.     [provider]      Allergies    Patient has no known allergies.    Review of Systems   Review of Systems  Musculoskeletal:  Positive for neck pain.    Physical Exam Updated Vital Signs BP (!) 172/98 (BP Location: Right Arm)   Pulse 75   Temp 98.3 F (36.8 C) (Oral)   Resp 18   Ht 5\' 5"  (1.651 m)   Wt 82.6 kg   SpO2 98%   BMI 30.29 kg/m  Physical Exam Vitals and nursing note reviewed.  Constitutional:      General: She is not in acute distress.    Appearance: She is well-developed. She is not diaphoretic.  HENT:     Head: Normocephalic and atraumatic.  Eyes:     Pupils: Pupils are equal, round, and reactive to light.  Cardiovascular:     Rate and Rhythm: Normal rate and regular rhythm.     Heart sounds: No murmur heard.    No friction rub. No gallop.  Pulmonary:     Effort: Pulmonary effort is normal.     Breath sounds: No wheezing or rales.  Abdominal:     General: There is no distension.     Palpations: Abdomen is soft.     Tenderness:  There is no abdominal tenderness.  Musculoskeletal:        General: No tenderness.     Cervical back: Normal range of motion and neck supple.     Comments: Tenderness over the trapezius muscle belly.  No midline C-spine tenderness.  Pulse motor and sensation intact to the right upper extremity.  No appreciable weakness.  Skin:    General: Skin is warm and dry.  Neurological:     Mental Status: She is alert and oriented to person, place, and time.  Psychiatric:        Behavior: Behavior normal.     ED Results / Procedures / Treatments   Labs (all labs ordered are listed, but only abnormal results are displayed) Labs Reviewed - No data to display  EKG None  Radiology No results found.  Procedures Procedures    Medications Ordered in ED Medications  ketorolac (TORADOL) 15 MG/ML injection 15 mg (15 mg Intramuscular Given 05/17/22 0105)  acetaminophen (TYLENOL) tablet 1,000 mg (1,000 mg Oral Given 05/17/22 0104)    ED Course/ Medical Decision Making/ A&P  Medical Decision Making Risk OTC drugs. Prescription drug management.   61 yo F with a chief complaints of right-sided neck pain.  This been going on for 3-1/2 weeks.  Was likely trapezius strain by history and physical exam.  Perhaps not improving due to the patient's occupation or how she handles her self.  I discussed this with her, she does have a referral out for physical therapy through her PCP.  We will give her follow-up with sports medicine.  1:21 AM:  I have discussed the diagnosis/risks/treatment options with the patient.  Evaluation and diagnostic testing in the emergency department does not suggest an emergent condition requiring admission or immediate intervention beyond what has been performed at this time.  They will follow up with  PCP. We also discussed returning to the ED immediately if new or worsening sx occur. We discussed the sx which are most concerning (e.g., sudden worsening  pain, fever, inability to tolerate by mouth) that necessitate immediate return. Medications administered to the patient during their visit and any new prescriptions provided to the patient are listed below.  Medications given during this visit Medications  ketorolac (TORADOL) 15 MG/ML injection 15 mg (15 mg Intramuscular Given 05/17/22 0105)  acetaminophen (TYLENOL) tablet 1,000 mg (1,000 mg Oral Given 05/17/22 0104)     The patient appears reasonably screen and/or stabilized for discharge and I doubt any other medical condition or other [facility] requiring further screening, evaluation, or treatment in the ED at this time prior to discharge.          Final Clinical Impression(s) / ED Diagnoses Final diagnoses:  Trapezius strain, right, initial encounter    Rx / DC Orders ED Discharge Orders     None         Melene Plan, DO 05/17/22 0121

## 2022-05-17 NOTE — Discharge Instructions (Signed)
Take 4 over the counter ibuprofen tablets 3 times a day or 2 over-the-counter naproxen tablets twice a day for pain. Also take tylenol 1000mg(2 extra strength) four times a day.    

## 2022-05-17 NOTE — ED Triage Notes (Addendum)
Pt states hse has had neck and rt. Shoulder pain x 3 weeks.  Saw PCP last week and prescribed meds that are not helping.  Denies any injury. Zanaflex 05/03/22 & diclofenac 6/27/23and also taking OTC meds with no relief

## 2022-05-24 NOTE — Progress Notes (Signed)
    Yesenia Salas D.[facility] [address] [place] [address] Phone: [phone]   Assessment and Plan:     There are no diagnoses linked to this encounter.  ***   Pertinent previous records reviewed include ***   Follow Up: ***     Subjective:   I, Yesenia Salas, am serving as a Neurosurgeon for Doctor Richardean Sale  Chief Complaint: trapezius strain   HPI:   05/25/2022 Patient is a 61 year old female complaining of trapezius strain. Patient states  Relevant Historical Information: ***  Additional pertinent review of systems negative.   Current Outpatient Medications:    atorvastatin (LIPITOR) 80 MG tablet, Take 1 tablet (80 mg total) by mouth daily at 6 PM., Disp: 30 tablet, Rfl: 0   Levothyroxine Sodium 125 MCG CAPS, Take 112 mcg by mouth daily before breakfast. , Disp: , Rfl:    Objective:     There were no vitals filed for this visit.    There is no height or weight on file to calculate BMI.    Physical Exam:    ***   Electronically signed by:  Yesenia Salas [facility] 9:59 AM 05/24/22

## 2022-05-25 ENCOUNTER — Ambulatory Visit (INDEPENDENT_AMBULATORY_CARE_PROVIDER_SITE_OTHER): Payer: Medicaid Other | Admitting: Sports Medicine

## 2022-05-25 VITALS — BP 136/82 | HR 80 | Ht 65.0 in | Wt 185.0 lb

## 2022-05-25 DIAGNOSIS — S46811A Strain of other muscles, fascia and tendons at shoulder and upper arm level, right arm, initial encounter: Secondary | ICD-10-CM

## 2022-05-25 MED ORDER — MELOXICAM 15 MG PO TABS: 15.0000 mg | ORAL_TABLET | Freq: Every day | ORAL | 0 refills | Status: AC

## 2022-05-25 NOTE — Patient Instructions (Addendum)
Good to see you  - Start meloxicam 15 mg daily x2 weeks.  If still having pain after 2 weeks, complete 3rd-week of meloxicam. May use remaining meloxicam as needed once daily for pain control.  Do not to use additional NSAIDs while taking meloxicam.  May use Tylenol [phone] mg 2 to 3 times a day for breakthrough pain. Trap HEP  Work note provided  2 week follow up

## 2022-06-07 NOTE — Progress Notes (Unsigned)
    Yesenia Salas D.[facility] [address] [place] [address] Phone: [phone]   Assessment and Plan:     There are no diagnoses linked to this encounter.  ***   Pertinent previous records reviewed include ***   Follow Up: ***     Subjective:   I, Yesenia Salas, am serving as a Neurosurgeon for Doctor Richardean Sale   Chief Complaint: trapezius strain    HPI:    05/25/2022 Patient is a 61 year old female complaining of trapezius strain. Patient states complaints of right-sided neck pain.  This been going on for about 5  weeks now.  No obvious injury worse with certain movements palpation and twisting.  Is seen her family doctor multiple times for this.  Has been tried on different medications without significant improvement.  Feels like the pain is moving down her arm now, does get numbness and tingling, was given a muscle relaxer didn't work and cant remember the name , taking Advil and tylenol for the pain now    06/08/2022 Patient states   Relevant Historical Information: None pertinent  Additional pertinent review of systems negative.   Current Outpatient Medications:    atorvastatin (LIPITOR) 80 MG tablet, Take 1 tablet (80 mg total) by mouth daily at 6 PM., Disp: 30 tablet, Rfl: 0   Levothyroxine Sodium 125 MCG CAPS, Take 112 mcg by mouth daily before breakfast. , Disp: , Rfl:    meloxicam (MOBIC) 15 MG tablet, Take 1 tablet (15 mg total) by mouth daily., Disp: 30 tablet, Rfl: 0   Objective:     There were no vitals filed for this visit.    There is no height or weight on file to calculate BMI.    Physical Exam:    ***   Electronically signed by:  Yesenia Salas [facility] 7:55 AM 06/07/22

## 2022-06-08 ENCOUNTER — Ambulatory Visit (INDEPENDENT_AMBULATORY_CARE_PROVIDER_SITE_OTHER): Payer: Medicaid Other | Admitting: Sports Medicine

## 2022-06-08 ENCOUNTER — Ambulatory Visit (INDEPENDENT_AMBULATORY_CARE_PROVIDER_SITE_OTHER): Payer: Medicaid Other

## 2022-06-08 VITALS — BP 130/82 | HR 83 | Ht 65.0 in | Wt 185.0 lb

## 2022-06-08 DIAGNOSIS — S46811D Strain of other muscles, fascia and tendons at shoulder and upper arm level, right arm, subsequent encounter: Secondary | ICD-10-CM

## 2022-06-08 DIAGNOSIS — M5030 Other cervical disc degeneration, unspecified cervical region: Secondary | ICD-10-CM

## 2022-06-08 NOTE — Patient Instructions (Addendum)
Good to see you Xray on the way out  Continue HEP  Meloxicam as needed As needed follow up

## 2022-06-10 DIAGNOSIS — M4712 Other spondylosis with myelopathy, cervical region: Secondary | ICD-10-CM | POA: Diagnosis not present

## 2022-06-10 DIAGNOSIS — E7849 Other hyperlipidemia: Secondary | ICD-10-CM | POA: Diagnosis not present

## 2022-06-10 DIAGNOSIS — R7303 Prediabetes: Secondary | ICD-10-CM | POA: Diagnosis not present

## 2022-06-10 DIAGNOSIS — E039 Hypothyroidism, unspecified: Secondary | ICD-10-CM | POA: Diagnosis not present

## 2022-06-15 DIAGNOSIS — Z419 Encounter for procedure for purposes other than remedying health state, unspecified: Secondary | ICD-10-CM | POA: Diagnosis not present

## 2022-06-16 DIAGNOSIS — M5412 Radiculopathy, cervical region: Secondary | ICD-10-CM | POA: Diagnosis not present

## 2022-06-19 ENCOUNTER — Other Ambulatory Visit: Payer: Self-pay | Admitting: Sports Medicine

## 2022-06-30 ENCOUNTER — Ambulatory Visit: Payer: Medicaid Other | Admitting: Physical Therapy

## 2022-07-05 ENCOUNTER — Ambulatory Visit: Payer: Medicaid Other | Attending: Physical Medicine and Rehabilitation

## 2022-07-05 DIAGNOSIS — M542 Cervicalgia: Secondary | ICD-10-CM | POA: Insufficient documentation

## 2022-07-05 DIAGNOSIS — M6281 Muscle weakness (generalized): Secondary | ICD-10-CM | POA: Diagnosis not present

## 2022-07-05 NOTE — Therapy (Signed)
OUTPATIENT PHYSICAL THERAPY CERVICAL EVALUATION   Patient Name: Yesenia Salas MRN: [id number] DOB:Mar 07, 1961, 61 y.o., female Today's Date: 07/05/2022   PT End of Session - 07/05/22 1411     Visit Number 1    Number of Visits 12    Date for PT Re-Evaluation 08/16/22    Authorization Type MCD [facility]    PT Start Time 1335    PT Stop Time 1410    PT Time Calculation (min) 35 min    Activity Tolerance Patient tolerated treatment well    Behavior During Therapy [facility] for tasks assessed/performed             Past Medical History:  Diagnosis Date   Hypothyroidism, postsurgical    Past Surgical History:  Procedure Laterality Date   THYROIDECTOMY  02-2004   Patient Active Problem List   Diagnosis Date Noted   Migraine 05/17/2019   TIA (transient ischemic attack) 05/16/2019   Blood pressure elevated without history of HTN 05/16/2019   Hyperglycemia 05/16/2019   Severe cervical dysplasia, histologically confirmed 10/03/2018   ASCUS with positive high risk HPV cervical 09/06/2018   Hypothyroidism 10/17/2007    PCP: Fleet Contras, MD  REFERRING PROVIDER: Romero Belling, MD  REFERRING DIAG: cervical radiculopathy with c5-6 disc bulge  THERAPY DIAG:  Cervicalgia - Plan: PT plan of care cert/re-cert  Muscle weakness (generalized) - Plan: PT plan of care cert/re-cert  Rationale for Evaluation and Treatment Rehabilitation  ONSET DATE: June 2023  SUBJECTIVE:                                                                                                                                                                                                         SUBJECTIVE STATEMENT: Pt presents to PT with reports of roughly two month history of neck and upper trap pain. Initial right upper trap pain has mostly resolved with discomfort now mainly in her L side. She works a Health and safety inspector job and notes that by the end of the day she is in spasm. Has occasionally N/T down R UE,  although this is also becoming less.   PERTINENT HISTORY:  TIA  PAIN:  Are you having pain?  Yes: NPRS scale: 4/10 (7/10 at worst) Pain location: neck, left upper trap Aggravating factors: work Relieving factors: rest  PRECAUTIONS: None  WEIGHT BEARING RESTRICTIONS No  FALLS:  Has patient fallen in last 6 months? No  LIVING ENVIRONMENT: Lives with: lives with their family Lives in: House/apartment Stairs: No Has following equipment at home: None  OCCUPATION: desk work for [employer]  PLOF: Independent and Independent with basic ADLs  PATIENT GOALS: decrease neck pain, get management strategies for when she is in spasm  OBJECTIVE:   DIAGNOSTIC FINDINGS:  CLINICAL DATA:  Neck pain radiating into the right shoulder   EXAM: CERVICAL SPINE - 2-3 VIEW   COMPARISON:  None Available.   FINDINGS: No evidence of acute fracture. Mild multilevel cervical spondylosis and disc space height loss. Mild cervical facet arthropathy. The dens is well positioned between the lateral masses of C1. Normal prevertebral soft tissues. Surgical clips in the neck.   IMPRESSION: No evidence of acute fracture.  Mild multilevel spondylosis.  PATIENT SURVEYS:  NDI: 23% disability  COGNITION: Overall cognitive status: Within functional limits for tasks assessed  SENSATION: WFL  POSTURE:  rounded shoulders and forward head  PALPATION: TTP to L upper trap and suboccipitals   CERVICAL ROM:   Active ROM A/PROM (deg) eval  Flexion   Extension   Right lateral flexion   Left lateral flexion   Right rotation 45  Left rotation 55   (Blank rows = not tested)  UPPER EXTRMEITY ROM:  AROM Right 07/05/2022 Left 07/05/2022  Shoulder flexion [facility]  Shoulder extension    Shoulder abduction    Shoulder internal rotation    Shoulder external rotation    Elbow flexion    Elbow extension    Wrist flexion    Wrist extension    (Blank rows = not tested, score listed  is out of 5 possible points.  N = WNL, D = diminished, * = concordant pain with testing)    UPPER EXTREMITY MMT:  MMT Right eval Left eval  Shoulder flexion    Shoulder abduction    Shoulder IR    Shoulder ER    Shoulder extension    Upper trap    Middle trap    Lower trap    Elbow flexion     Elbow extension    Wrist flexion    Wrist extension    Grossly WFL WFL   (Blank rows = not tested)   CERVICAL SPECIAL TESTS:  Upper limb tension test (ULTT): Positive  FUNCTIONAL TESTS:  N/A  TODAY'S TREATMENT:  [facility] Adult PT Treatment:                                                DATE: 07/05/2022 Therapeutic Exercise: Seated upper trap stretch x 30" each Cervical SNAG rot/ext x 5 - 5" hold Row x 10 - black TB Supine chin tuck x 5 - 5" hold  PATIENT EDUCATION:  Education details: eval findings, NDI, HEP, POC Person educated: Patient Education method: Explanation, Demonstration, and Handouts Education comprehension: verbalized understanding and returned demonstration   HOME EXERCISE PROGRAM: Access Code: [email or web address] URL: [email or web address] Date: 07/05/2022 Prepared by: Edwinna Areola  Exercises - Seated Upper Trapezius Stretch  - 1 x daily - 7 x weekly - 2 sets - 30 secc hold - Seated Assisted Cervical Rotation with Towel  - 1 x daily - 7 x weekly - 2 sets - 10 reps - 5 sec hold - cervical extension snag with towel  - 1 x daily - 7 x weekly - 3 sets - 10 reps - 5 sec hold - Standing Shoulder Row with Anchored Resistance  - 1 x daily - 7 x weekly - 3 sets - 10  reps - Supine Chin Tuck  - 1 x daily - 7 x weekly - 2 sets - 10 reps - 5 sec hold  ASSESSMENT:  CLINICAL IMPRESSION: Patient is a 61 y.o. F who was seen today for physical therapy evaluation and treatment for neck and upper trap pain. Physical findings are consistent with physician impression with pt demonstrating decreased neck ROM and positive provocation with neural tension testing down R UE. Her  NDI demonstrates moderate disability with performance of work and home ADLs, indicating she is operating below her PLOF. Pt would benefit from skilled PT services and will continue to be seen and progressed as able per POC.     OBJECTIVE IMPAIRMENTS decreased activity tolerance, decreased ROM, decreased strength, and pain.   ACTIVITY LIMITATIONS carrying, lifting, and reach over head  PARTICIPATION LIMITATIONS: community activity, occupation, and yard work  PERSONAL FACTORS Fitness and 1 comorbidity: TIA  are also affecting patient's functional outcome.   REHAB POTENTIAL: Excellent  CLINICAL DECISION MAKING: Stable/uncomplicated  EVALUATION COMPLEXITY: Low   GOALS: Goals reviewed with patient? No  SHORT TERM GOALS: Target date: 07/26/2022   Pt will be compliant and knowledgeable with initial HEP for improved comfort and carryover Baseline: initial HEP given  Goal status: INITIAL  2.  Pt will self report neck and upper trap pain no greater than 5/10 for improved comfort and functional ability Baseline: 7/10 at worst Goal status: INITIAL   LONG TERM GOALS: Target date: 08/16/2022  Pt will self report neck and upper trap pain no greater than 2/10 for improved comfort and functional ability Baseline: 7/10 at worst Goal status: INITIAL  2.  Pt will decrease NDI disability score to no greater than 15% as proxy for functional improvement Baseline: 23% disability Goal status: INITIAL  3.  Pt will improve bilateral cervical rotation AROM to no less than 60 deg for improved functional mobility with work and driving Baseline: see chart Goal status: INITIAL  PLAN: PT FREQUENCY: 1-2x/week  PT DURATION: 6 weeks  PLANNED INTERVENTIONS: Therapeutic exercises, Therapeutic activity, Neuromuscular re-education, Balance training, Gait training, Patient/Family education, Self Care, Joint mobilization, Dry Needling, Electrical stimulation, Cryotherapy, Moist heat, Manual therapy, and  Re-evaluation  PLAN FOR NEXT SESSION: assess HEP response, DNF and periscapular strengthening   [employer] Authorization   Choose one: Rehabilitative  Standardized Assessment or Functional Outcome Tool: See Pain Assessment and NDI  Score or Percent Disability: 23% disability  Body Parts Treated (Select each separately):  Cervicothoracic. Overall deficits/functional limitations for body part selected: moderate N/A. Overall deficits/functional limitations for body part selected: N/A N/A. Overall deficits/functional limitations for body part selected: N/A   If treatment provided at initial evaluation, no treatment charged due to lack of authorization.    Eloy End, PT 07/05/2022, 2:31 PM

## 2022-07-12 ENCOUNTER — Ambulatory Visit: Payer: Medicaid Other

## 2022-07-12 DIAGNOSIS — M6281 Muscle weakness (generalized): Secondary | ICD-10-CM

## 2022-07-12 DIAGNOSIS — M542 Cervicalgia: Secondary | ICD-10-CM | POA: Diagnosis not present

## 2022-07-12 NOTE — Therapy (Addendum)
OUTPATIENT PHYSICAL THERAPY TREATMENT NOTE   Patient Name: Yesenia Salas MRN: [id number] DOB:Oct 30, 1961, 61 y.o., female Today's Date: 07/12/2022  PCP: Fleet Contras, MD REFERRING PROVIDER: Romero Belling, MD  END OF SESSION:   PT End of Session - 07/12/22 1449     Visit Number 2    Number of Visits 12    Date for PT Re-Evaluation 08/16/22    Authorization Type [facility]    PT Start Time 1450    PT Stop Time 1528    PT Time Calculation (min) 38 min    Activity Tolerance Patient tolerated treatment well    Behavior During Therapy [facility] for tasks assessed/performed             Past Medical History:  Diagnosis Date   Hypothyroidism, postsurgical    Past Surgical History:  Procedure Laterality Date   THYROIDECTOMY  02-2004   Patient Active Problem List   Diagnosis Date Noted   Migraine 05/17/2019   TIA (transient ischemic attack) 05/16/2019   Blood pressure elevated without history of HTN 05/16/2019   Hyperglycemia 05/16/2019   Severe cervical dysplasia, histologically confirmed 10/03/2018   ASCUS with positive high risk HPV cervical 09/06/2018   Hypothyroidism 10/17/2007    REFERRING DIAG: cervical radiculopathy with c5-6 disc bulge  THERAPY DIAG:  Cervicalgia  Muscle weakness (generalized)  Rationale for Evaluation and Treatment Rehabilitation  PERTINENT HISTORY: TIA  PRECAUTIONS: None  SUBJECTIVE:  Pt presents to PT with continued reports of neck pain and spasm. Has been compliant with initial HEP with no adverse effect. Pt is ready to begin PT at this time.   PAIN:  Are you having pain?  Yes: NPRS scale: 3/10 (7/10 at worst) Pain location: neck, left upper trap Aggravating factors: work Relieving factors: rest   OBJECTIVE: (objective measures completed at initial evaluation unless otherwise dated)  DIAGNOSTIC FINDINGS:  CLINICAL DATA:  Neck pain radiating into the right shoulder   EXAM: CERVICAL SPINE - 2-3 VIEW   COMPARISON:   None Available.   FINDINGS: No evidence of acute fracture. Mild multilevel cervical spondylosis and disc space height loss. Mild cervical facet arthropathy. The dens is well positioned between the lateral masses of C1. Normal prevertebral soft tissues. Surgical clips in the neck.   IMPRESSION: No evidence of acute fracture.  Mild multilevel spondylosis.   PATIENT SURVEYS:  NDI: 23% disability   COGNITION: Overall cognitive status: Within functional limits for tasks assessed   SENSATION: WFL   POSTURE:  rounded shoulders and forward head   PALPATION: TTP to L upper trap and suboccipitals        CERVICAL ROM:    Active ROM A/PROM (deg) eval  Flexion    Extension    Right lateral flexion    Left lateral flexion    Right rotation 45  Left rotation 55   (Blank rows = not tested)   UPPER EXTRMEITY ROM:   AROM Right 07/05/2022 Left 07/05/2022  Shoulder flexion [facility]  Shoulder extension      Shoulder abduction      Shoulder internal rotation      Shoulder external rotation      Elbow flexion      Elbow extension      Wrist flexion      Wrist extension      (Blank rows = not tested, score listed is out of 5 possible points.  N = WNL, D = diminished, * = concordant pain with testing)  UPPER EXTREMITY MMT:   MMT Right eval Left eval  Shoulder flexion      Shoulder abduction      Shoulder IR      Shoulder ER      Shoulder extension      Upper trap      Middle trap      Lower trap      Elbow flexion       Elbow extension      Wrist flexion      Wrist extension      Grossly WFL WFL   (Blank rows = not tested)     CERVICAL SPECIAL TESTS:  Upper limb tension test (ULTT): Positive   FUNCTIONAL TESTS:  N/A   TODAY'S TREATMENT:  [facility] Adult PT Treatment:                                                DATE: 07/12/2022 Therapeutic Exercise: UBE lvl 1.0 x 4 min (57fwd/2bwd) while taking subjective Row 2x10 17# Seated bilateral ER 2x10 RTB Seated  horizontal abd 2x10 RTB Supine chin tuck x 10 - 5" hold Seated median nerve glide x 10 each Manual Threapy: Suboccipital release, postiional release to upper traps, STM to bilat upper traps Modalties: MHP to cervical spine and upper trap  [facility] Adult PT Treatment:                                                DATE: 07/05/2022 Therapeutic Exercise: Seated upper trap stretch x 30" each Cervical SNAG rot/ext x 5 - 5" hold Row x 10 - black TB Supine chin tuck x 5 - 5" hold   PATIENT EDUCATION:  Education details: N/A Person educated: N/A Education method: N/A Education comprehension: N/A     HOME EXERCISE PROGRAM: Access Code: [email or web address] URL: [email or web address] Date: 07/12/2022 Prepared by: Edwinna Areola  Exercises - Seated Upper Trapezius Stretch  - 1 x daily - 7 x weekly - 2 sets - 30 secc hold - Seated Assisted Cervical Rotation with Towel  - 1 x daily - 7 x weekly - 2 sets - 10 reps - 5 sec hold - cervical extension snag with towel  - 1 x daily - 7 x weekly - 3 sets - 10 reps - 5 sec hold - Standing Shoulder Row with Anchored Resistance  - 1 x daily - 7 x weekly - 3 sets - 10 reps - Supine Chin Tuck  - 1 x daily - 7 x weekly - 2 sets - 10 reps - 5 sec hold - Median Nerve Tensioner  - 1 x daily - 7 x weekly - 2-3 sets - 10 reps   ASSESSMENT:   CLINICAL IMPRESSION: Pt was able to complete all prescribed exercises with no adverse effect or increase in pain. Therapy today focused on improving periscapular and DNF strength in order to decrease pain and improve function. Seemed to respond well to manual therapy interventions, HEP updated for decreasing neural tension at home in UE. Will continue to progress as tolerated per POC.      OBJECTIVE IMPAIRMENTS decreased activity tolerance, decreased ROM, decreased strength, and pain.    ACTIVITY LIMITATIONS carrying, lifting, and reach over head  PARTICIPATION LIMITATIONS: community activity, occupation, and yard  work   PERSONAL FACTORS Fitness and 1 comorbidity: TIA  are also affecting patient's functional outcome.    REHAB POTENTIAL: Excellent   CLINICAL DECISION MAKING: Stable/uncomplicated   EVALUATION COMPLEXITY: Low     GOALS: Goals reviewed with patient? No   SHORT TERM GOALS: Target date: 07/26/2022    Pt will be compliant and knowledgeable with initial HEP for improved comfort and carryover Baseline: initial HEP given  Goal status: INITIAL   2.  Pt will self report neck and upper trap pain no greater than 5/10 for improved comfort and functional ability Baseline: 7/10 at worst Goal status: INITIAL     LONG TERM GOALS: Target date: 08/16/2022   Pt will self report neck and upper trap pain no greater than 2/10 for improved comfort and functional ability Baseline: 7/10 at worst Goal status: INITIAL   2.  Pt will decrease NDI disability score to no greater than 15% as proxy for functional improvement Baseline: 23% disability Goal status: INITIAL   3.  Pt will improve bilateral cervical rotation AROM to no less than 60 deg for improved functional mobility with work and driving Baseline: see chart Goal status: INITIAL   PLAN: PT FREQUENCY: 1-2x/week   PT DURATION: 6 weeks   PLANNED INTERVENTIONS: Therapeutic exercises, Therapeutic activity, Neuromuscular re-education, Balance training, Gait training, Patient/Family education, Self Care, Joint mobilization, Dry Needling, Electrical stimulation, Cryotherapy, Moist heat, Manual therapy, and Re-evaluation   PLAN FOR NEXT SESSION: assess HEP response, DNF and periscapular strengthening     Eloy End, PT 07/12/2022, 3:38 PM

## 2022-07-16 DIAGNOSIS — Z419 Encounter for procedure for purposes other than remedying health state, unspecified: Secondary | ICD-10-CM | POA: Diagnosis not present

## 2022-07-21 ENCOUNTER — Ambulatory Visit: Payer: Medicaid Other | Attending: Physical Medicine and Rehabilitation

## 2022-07-21 DIAGNOSIS — M6281 Muscle weakness (generalized): Secondary | ICD-10-CM | POA: Diagnosis not present

## 2022-07-21 DIAGNOSIS — M542 Cervicalgia: Secondary | ICD-10-CM | POA: Insufficient documentation

## 2022-07-21 NOTE — Therapy (Signed)
OUTPATIENT PHYSICAL THERAPY TREATMENT NOTE   Patient Name: Yesenia Salas MRN: [id number] DOB:December 25, 1960, 61 y.o., female Today's Date: 07/21/2022  PCP: Nolene Ebbs, MD REFERRING PROVIDER: Laroy Apple, MD  END OF SESSION:   PT End of Session - 07/21/22 1403     Visit Number 3    Number of Visits 12    Date for PT Re-Evaluation 08/16/22    Authorization Type MCD [facility]    PT Start Time 1403    PT Stop Time 3845    PT Time Calculation (min) 39 min    Activity Tolerance Patient tolerated treatment well    Behavior During Therapy [facility] for tasks assessed/performed              Past Medical History:  Diagnosis Date   Hypothyroidism, postsurgical    Past Surgical History:  Procedure Laterality Date   THYROIDECTOMY  02-2004   Patient Active Problem List   Diagnosis Date Noted   Migraine 05/17/2019   TIA (transient ischemic attack) 05/16/2019   Blood pressure elevated without history of HTN 05/16/2019   Hyperglycemia 05/16/2019   Severe cervical dysplasia, histologically confirmed 10/03/2018   ASCUS with positive high risk HPV cervical 09/06/2018   Hypothyroidism 10/17/2007    REFERRING DIAG: cervical radiculopathy with c5-6 disc bulge  THERAPY DIAG:  Cervicalgia  Muscle weakness (generalized)  Rationale for Evaluation and Treatment Rehabilitation  PERTINENT HISTORY: TIA  PRECAUTIONS: None  SUBJECTIVE:  Pt presents to PT with no current reports of neck pain or discomfort. She has been compliant with HEP with no adverse effect. Pt is ready to begin PT treatment at this time.   PAIN:  Are you having pain?  Yes: NPRS scale: 3/10 (7/10 at worst) Pain location: neck, left upper trap Aggravating factors: work Relieving factors: rest   OBJECTIVE: (objective measures completed at initial evaluation unless otherwise dated)  DIAGNOSTIC FINDINGS:  CLINICAL DATA:  Neck pain radiating into the right shoulder   EXAM: CERVICAL SPINE - 2-3 VIEW    COMPARISON:  None Available.   FINDINGS: No evidence of acute fracture. Mild multilevel cervical spondylosis and disc space height loss. Mild cervical facet arthropathy. The dens is well positioned between the lateral masses of C1. Normal prevertebral soft tissues. Surgical clips in the neck.   IMPRESSION: No evidence of acute fracture.  Mild multilevel spondylosis.   PATIENT SURVEYS:  NDI: 23% disability   COGNITION: Overall cognitive status: Within functional limits for tasks assessed   SENSATION: WFL   POSTURE:  rounded shoulders and forward head   PALPATION: TTP to L upper trap and suboccipitals        CERVICAL ROM:    Active ROM A/PROM (deg) eval  Flexion    Extension    Right lateral flexion    Left lateral flexion    Right rotation 45  Left rotation 55   (Blank rows = not tested)   UPPER EXTRMEITY ROM:   AROM Right 07/05/2022 Left 07/05/2022  Shoulder flexion [facility]  Shoulder extension      Shoulder abduction      Shoulder internal rotation      Shoulder external rotation      Elbow flexion      Elbow extension      Wrist flexion      Wrist extension      (Blank rows = not tested, score listed is out of 5 possible points.  N = WNL, D = diminished, * = concordant pain with testing)  UPPER EXTREMITY MMT:   MMT Right eval Left eval  Shoulder flexion      Shoulder abduction      Shoulder IR      Shoulder ER      Shoulder extension      Upper trap      Middle trap      Lower trap      Elbow flexion       Elbow extension      Wrist flexion      Wrist extension      Grossly WFL WFL   (Blank rows = not tested)     CERVICAL SPECIAL TESTS:  Upper limb tension test (ULTT): Positive   FUNCTIONAL TESTS:  N/A   TODAY'S TREATMENT:  [facility] Adult PT Treatment:                                                DATE: 07/21/2022 Therapeutic Exercise: UBE lvl 1.0 x 4 min (58fwd/2bwd) while taking subjective Row 3x10 black TB Seated bilateral ER  2x10 RTB Seated horizontal abd 2x10 GTB Supine chin tuck x 10 - 5" hold Serratus wall slide with soft foam YTB 2x10 S/L open book x 5 each Supine median nerve glide x 10 each Supine serratus punch x 10 2# Supine cane flexion 2x10  Manual Threapy: Suboccipital release, postiional release to upper traps, STM to bilat upper traps Modalties: MHP to cervical spine and upper trap  [facility] Adult PT Treatment:                                                DATE: 07/12/2022 Therapeutic Exercise: UBE lvl 1.0 x 4 min (21fwd/2bwd) while taking subjective Row 2x10 17# Seated bilateral ER 2x10 RTB Seated horizontal abd 2x10 RTB Supine chin tuck x 10 - 5" hold Seated median nerve glide x 10 each Modalties: MHP to cervical spine and upper trap  [facility] Adult PT Treatment:                                                DATE: 07/05/2022 Therapeutic Exercise: Seated upper trap stretch x 30" each Cervical SNAG rot/ext x 5 - 5" hold Row x 10 - black TB Supine chin tuck x 5 - 5" hold   PATIENT EDUCATION:  Education details: N/A Person educated: N/A Education method: N/A Education comprehension: N/A     HOME EXERCISE PROGRAM: Access Code: [email or web address] URL: [email or web address] Date: 07/12/2022 Prepared by: Octavio Manns  Exercises - Seated Upper Trapezius Stretch  - 1 x daily - 7 x weekly - 2 sets - 30 secc hold - Seated Assisted Cervical Rotation with Towel  - 1 x daily - 7 x weekly - 2 sets - 10 reps - 5 sec hold - cervical extension snag with towel  - 1 x daily - 7 x weekly - 3 sets - 10 reps - 5 sec hold - Standing Shoulder Row with Anchored Resistance  - 1 x daily - 7 x weekly - 3 sets - 10 reps - Supine Chin Tuck  - 1 x daily -  7 x weekly - 2 sets - 10 reps - 5 sec hold - Median Nerve Tensioner  - 1 x daily - 7 x weekly - 2-3 sets - 10 reps   ASSESSMENT:   CLINICAL IMPRESSION: Pt was again able to complete all prescribed exercises with no adverse effect or increase in pain.  Therapy focused on improving periscapular strength and DNF endurance in order to decrease pain and improve function. Will continue to progress exercises as tolerated per POC.     OBJECTIVE IMPAIRMENTS decreased activity tolerance, decreased ROM, decreased strength, and pain.    ACTIVITY LIMITATIONS carrying, lifting, and reach over head   PARTICIPATION LIMITATIONS: community activity, occupation, and yard work   PERSONAL FACTORS Fitness and 1 comorbidity: TIA  are also affecting patient's functional outcome.       GOALS: Goals reviewed with patient? No   SHORT TERM GOALS: Target date: 07/26/2022    Pt will be compliant and knowledgeable with initial HEP for improved comfort and carryover Baseline: initial HEP given  Goal status: MET   2.  Pt will self report neck and upper trap pain no greater than 5/10 for improved comfort and functional ability Baseline: 7/10 at worst Goal status: INITIAL     LONG TERM GOALS: Target date: 08/16/2022   Pt will self report neck and upper trap pain no greater than 2/10 for improved comfort and functional ability Baseline: 7/10 at worst Goal status: INITIAL   2.  Pt will decrease NDI disability score to no greater than 15% as proxy for functional improvement Baseline: 23% disability Goal status: INITIAL   3.  Pt will improve bilateral cervical rotation AROM to no less than 60 deg for improved functional mobility with work and driving Baseline: see chart Goal status: INITIAL   PLAN: PT FREQUENCY: 1-2x/week   PT DURATION: 6 weeks   PLANNED INTERVENTIONS: Therapeutic exercises, Therapeutic activity, Neuromuscular re-education, Balance training, Gait training, Patient/Family education, Self Care, Joint mobilization, Dry Needling, Electrical stimulation, Cryotherapy, Moist heat, Manual therapy, and Re-evaluation   PLAN FOR NEXT SESSION: assess HEP response, DNF and periscapular strengthening     Ward Chatters, PT 07/21/2022, 2:49 PM

## 2022-07-22 NOTE — Therapy (Signed)
OUTPATIENT PHYSICAL THERAPY TREATMENT NOTE   Patient Name: Yesenia Salas MRN: [id number] DOB:May 15, 1961, 61 y.o., female Today's Date: 07/22/2022  PCP: Nolene Ebbs, MD REFERRING PROVIDER: Laroy Apple, MD  END OF SESSION:      Past Medical History:  Diagnosis Date   Hypothyroidism, postsurgical    Past Surgical History:  Procedure Laterality Date   THYROIDECTOMY  02-2004   Patient Active Problem List   Diagnosis Date Noted   Migraine 05/17/2019   TIA (transient ischemic attack) 05/16/2019   Blood pressure elevated without history of HTN 05/16/2019   Hyperglycemia 05/16/2019   Severe cervical dysplasia, histologically confirmed 10/03/2018   ASCUS with positive high risk HPV cervical 09/06/2018   Hypothyroidism 10/17/2007    REFERRING DIAG: cervical radiculopathy with c5-6 disc bulge  THERAPY DIAG:  No diagnosis found.  Rationale for Evaluation and Treatment Rehabilitation  PERTINENT HISTORY: TIA  PRECAUTIONS: None  SUBJECTIVE:  ***  PAIN:  Are you having pain?  Yes: NPRS scale: 3/10 (7/10 at worst) Pain location: neck, left upper trap Aggravating factors: work Relieving factors: rest   OBJECTIVE: (objective measures completed at initial evaluation unless otherwise dated)  DIAGNOSTIC FINDINGS:  CLINICAL DATA:  Neck pain radiating into the right shoulder   EXAM: CERVICAL SPINE - 2-3 VIEW   COMPARISON:  None Available.   FINDINGS: No evidence of acute fracture. Mild multilevel cervical spondylosis and disc space height loss. Mild cervical facet arthropathy. The dens is well positioned between the lateral masses of C1. Normal prevertebral soft tissues. Surgical clips in the neck.   IMPRESSION: No evidence of acute fracture.  Mild multilevel spondylosis.   PATIENT SURVEYS:  NDI: 23% disability   COGNITION: Overall cognitive status: Within functional limits for tasks assessed   SENSATION: WFL   POSTURE:  rounded shoulders and  forward head   PALPATION: TTP to L upper trap and suboccipitals        CERVICAL ROM:    Active ROM A/PROM (deg) eval  Flexion    Extension    Right lateral flexion    Left lateral flexion    Right rotation 45  Left rotation 55   (Blank rows = not tested)   UPPER EXTRMEITY ROM:   AROM Right 07/05/2022 Left 07/05/2022  Shoulder flexion [facility]  Shoulder extension      Shoulder abduction      Shoulder internal rotation      Shoulder external rotation      Elbow flexion      Elbow extension      Wrist flexion      Wrist extension      (Blank rows = not tested, score listed is out of 5 possible points.  N = WNL, D = diminished, * = concordant pain with testing)      UPPER EXTREMITY MMT:   MMT Right eval Left eval  Shoulder flexion      Shoulder abduction      Shoulder IR      Shoulder ER      Shoulder extension      Upper trap      Middle trap      Lower trap      Elbow flexion       Elbow extension      Wrist flexion      Wrist extension      Grossly WFL WFL   (Blank rows = not tested)     CERVICAL SPECIAL TESTS:  Upper limb tension test (  ULTT): Positive   FUNCTIONAL TESTS:  N/A   TODAY'S TREATMENT:  [facility] Adult PT Treatment:                                                DATE: 07/26/2022 Therapeutic Exercise: UBE lvl 1.0 x 4 min (59fwd/2bwd) while taking subjective Row 3x10 black TB Seated bilateral ER 2x10 RTB Seated horizontal abd 2x10 GTB Supine chin tuck x 10 - 5" hold Serratus wall slide with soft foam YTB 2x10 S/L open book x 5 each Supine median nerve glide x 10 each Supine serratus punch x 10 2# Supine cane flexion 2x10  Manual Threapy: Suboccipital release, postiional release to upper traps, STM to bilat upper traps Modalties: MHP to cervical spine and upper trap  [facility] Adult PT Treatment:                                                DATE: 07/21/2022 Therapeutic Exercise: UBE lvl 1.0 x 4 min (13fwd/2bwd) while taking subjective Row 3x10  black TB Seated bilateral ER 2x10 RTB Seated horizontal abd 2x10 GTB Supine chin tuck x 10 - 5" hold Serratus wall slide with soft foam YTB 2x10 S/L open book x 5 each Supine median nerve glide x 10 each Supine serratus punch x 10 2# Supine cane flexion 2x10  Manual Threapy: Suboccipital release, postiional release to upper traps, STM to bilat upper traps Modalties: MHP to cervical spine and upper trap  [facility] Adult PT Treatment:                                                DATE: 07/12/2022 Therapeutic Exercise: UBE lvl 1.0 x 4 min (58fwd/2bwd) while taking subjective Row 2x10 17# Seated bilateral ER 2x10 RTB Seated horizontal abd 2x10 RTB Supine chin tuck x 10 - 5" hold Seated median nerve glide x 10 each Modalties: MHP to cervical spine and upper trap  [facility] Adult PT Treatment:                                                DATE: 07/05/2022 Therapeutic Exercise: Seated upper trap stretch x 30" each Cervical SNAG rot/ext x 5 - 5" hold Row x 10 - black TB Supine chin tuck x 5 - 5" hold   PATIENT EDUCATION:  Education details: N/A Person educated: N/A Education method: N/A Education comprehension: N/A     HOME EXERCISE PROGRAM: Access Code: [email or web address] URL: [email or web address] Date: 07/12/2022 Prepared by: Octavio Manns  Exercises - Seated Upper Trapezius Stretch  - 1 x daily - 7 x weekly - 2 sets - 30 secc hold - Seated Assisted Cervical Rotation with Towel  - 1 x daily - 7 x weekly - 2 sets - 10 reps - 5 sec hold - cervical extension snag with towel  - 1 x daily - 7 x weekly - 3 sets - 10 reps - 5 sec hold - Standing Shoulder Row with Anchored Resistance  -  1 x daily - 7 x weekly - 3 sets - 10 reps - Supine Chin Tuck  - 1 x daily - 7 x weekly - 2 sets - 10 reps - 5 sec hold - Median Nerve Tensioner  - 1 x daily - 7 x weekly - 2-3 sets - 10 reps   ASSESSMENT:   CLINICAL IMPRESSION: ***     OBJECTIVE IMPAIRMENTS decreased activity tolerance, decreased  ROM, decreased strength, and pain.    ACTIVITY LIMITATIONS carrying, lifting, and reach over head   PARTICIPATION LIMITATIONS: community activity, occupation, and yard work   PERSONAL FACTORS Fitness and 1 comorbidity: TIA  are also affecting patient's functional outcome.       GOALS: Goals reviewed with patient? No   SHORT TERM GOALS: Target date: 07/26/2022    Pt will be compliant and knowledgeable with initial HEP for improved comfort and carryover Baseline: initial HEP given  Goal status: MET   2.  Pt will self report neck and upper trap pain no greater than 5/10 for improved comfort and functional ability Baseline: 7/10 at worst Goal status: INITIAL     LONG TERM GOALS: Target date: 08/16/2022   Pt will self report neck and upper trap pain no greater than 2/10 for improved comfort and functional ability Baseline: 7/10 at worst Goal status: INITIAL   2.  Pt will decrease NDI disability score to no greater than 15% as proxy for functional improvement Baseline: 23% disability Goal status: INITIAL   3.  Pt will improve bilateral cervical rotation AROM to no less than 60 deg for improved functional mobility with work and driving Baseline: see chart Goal status: INITIAL   PLAN: PT FREQUENCY: 1-2x/week   PT DURATION: 6 weeks   PLANNED INTERVENTIONS: Therapeutic exercises, Therapeutic activity, Neuromuscular re-education, Balance training, Gait training, Patient/Family education, Self Care, Joint mobilization, Dry Needling, Electrical stimulation, Cryotherapy, Moist heat, Manual therapy, and Re-evaluation   PLAN FOR NEXT SESSION: assess HEP response, DNF and periscapular strengthening     Ward Chatters, PT 07/22/2022, 2:05 PM

## 2022-07-26 ENCOUNTER — Ambulatory Visit: Payer: Medicaid Other

## 2022-07-26 DIAGNOSIS — M6281 Muscle weakness (generalized): Secondary | ICD-10-CM | POA: Diagnosis not present

## 2022-07-26 DIAGNOSIS — M542 Cervicalgia: Secondary | ICD-10-CM

## 2022-07-31 IMAGING — MG MM DIGITAL SCREENING BILAT W/ TOMO AND CAD
8 series · 8 of 24 positions shown · non-contrast
Comparison: Previous exam(s).

CLINICAL DATA: Screening.

EXAM:
DIGITAL SCREENING BILATERAL MAMMOGRAM WITH TOMOSYNTHESIS AND CAD
TECHNIQUE: Bilateral screening digital craniocaudal and mediolateral oblique
mammograms were obtained. Bilateral screening digital breast
tomosynthesis was performed. The images were evaluated with
computer-aided detection.

[R MLO synth-2D]
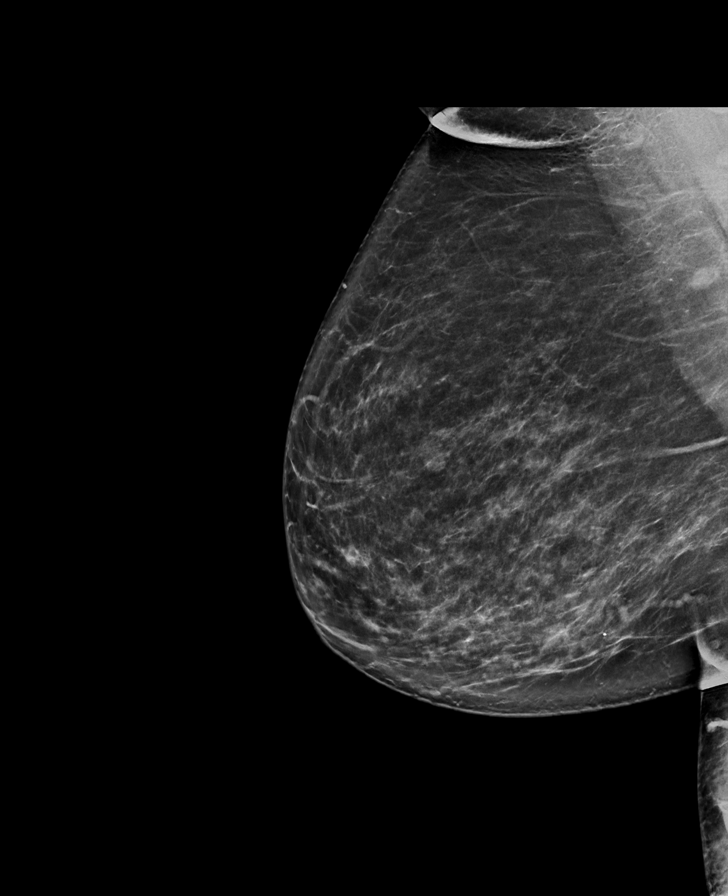

[L MLO synth-2D]
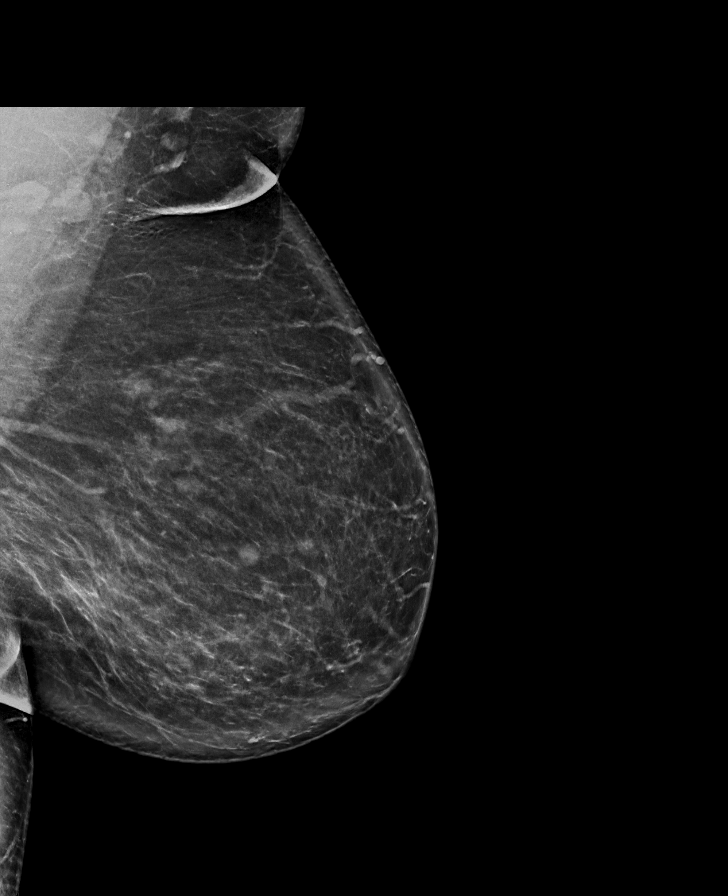

[L CC synth-2D]
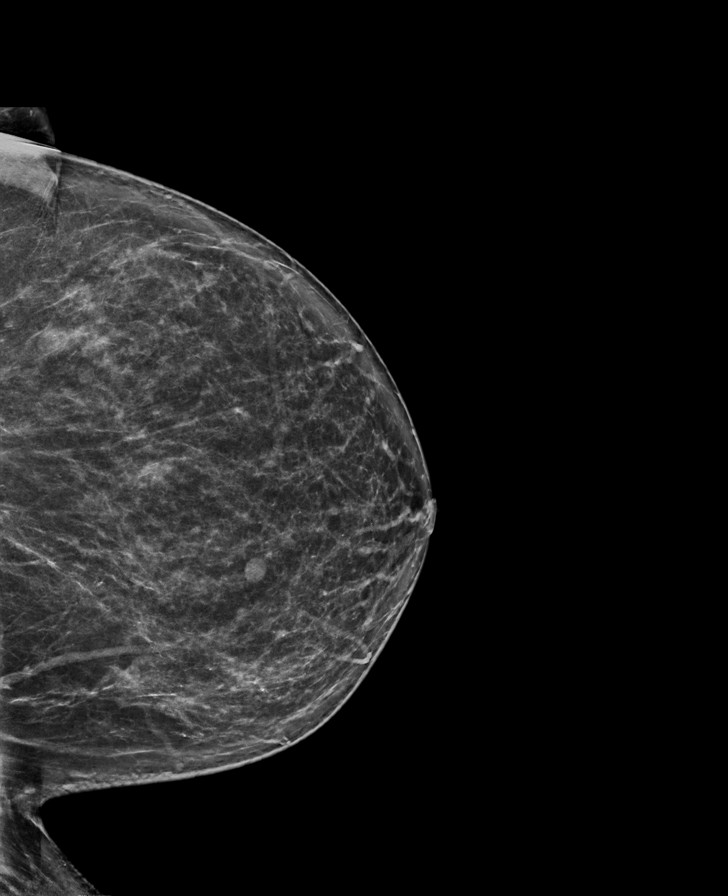

[R CC synth-2D]
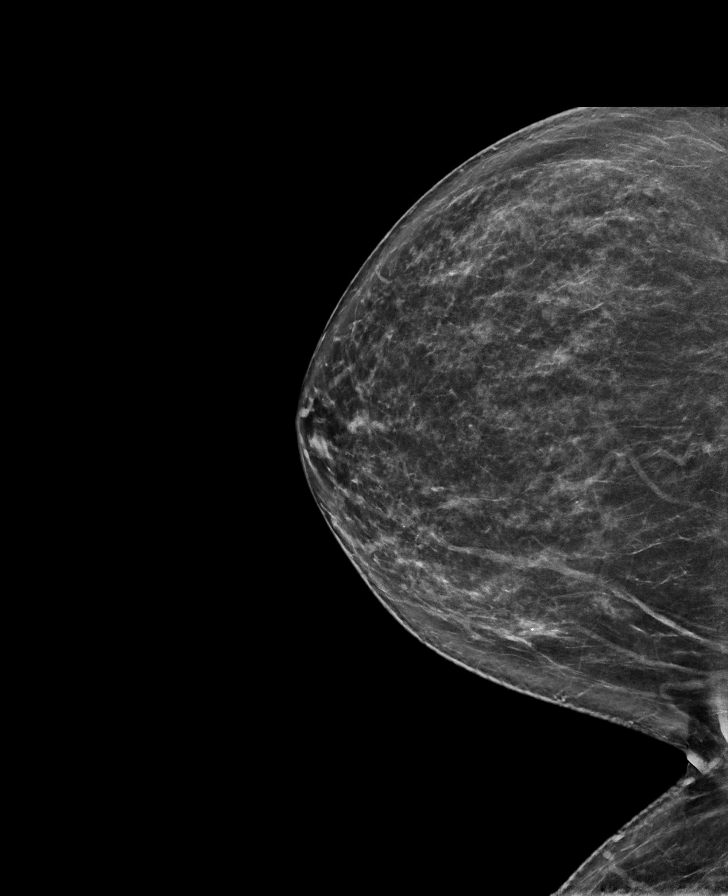

[R MLO tomo · tomo slice 44/87.0]
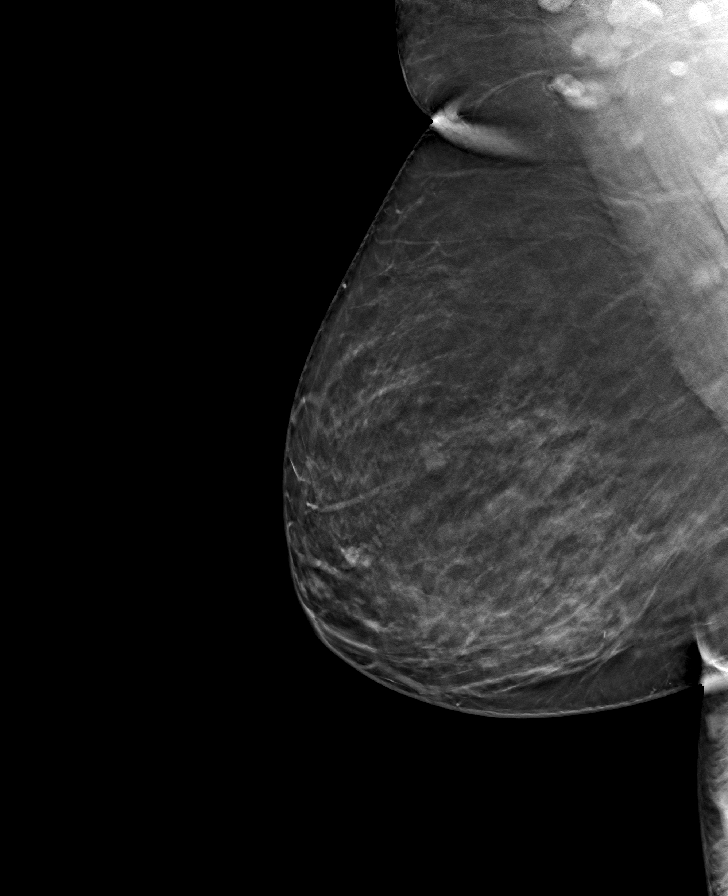

[R CC tomo · tomo slice 39/77.0]
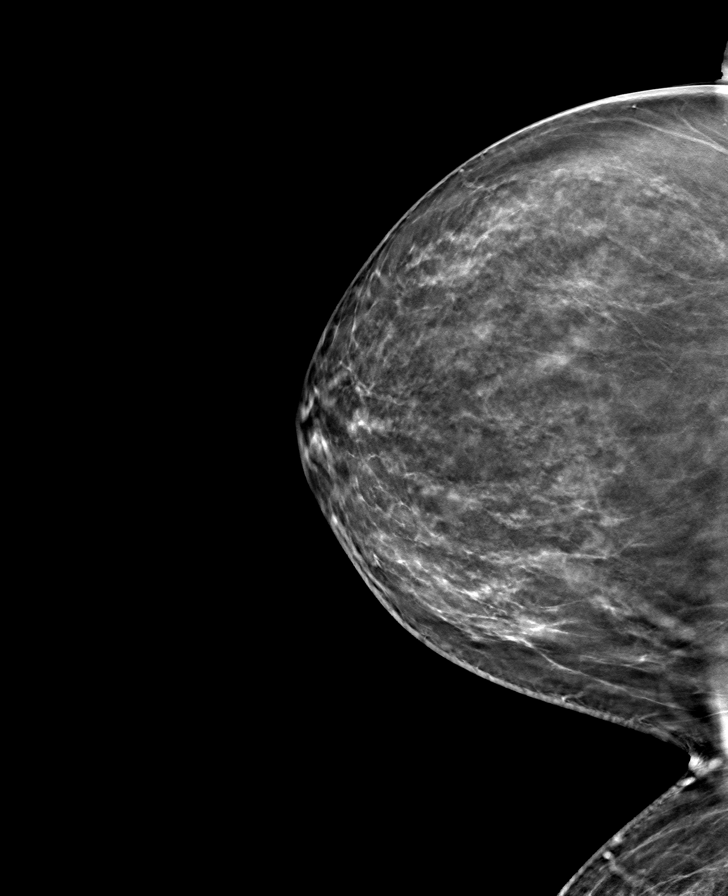

[L CC tomo · tomo slice 41/80.0]
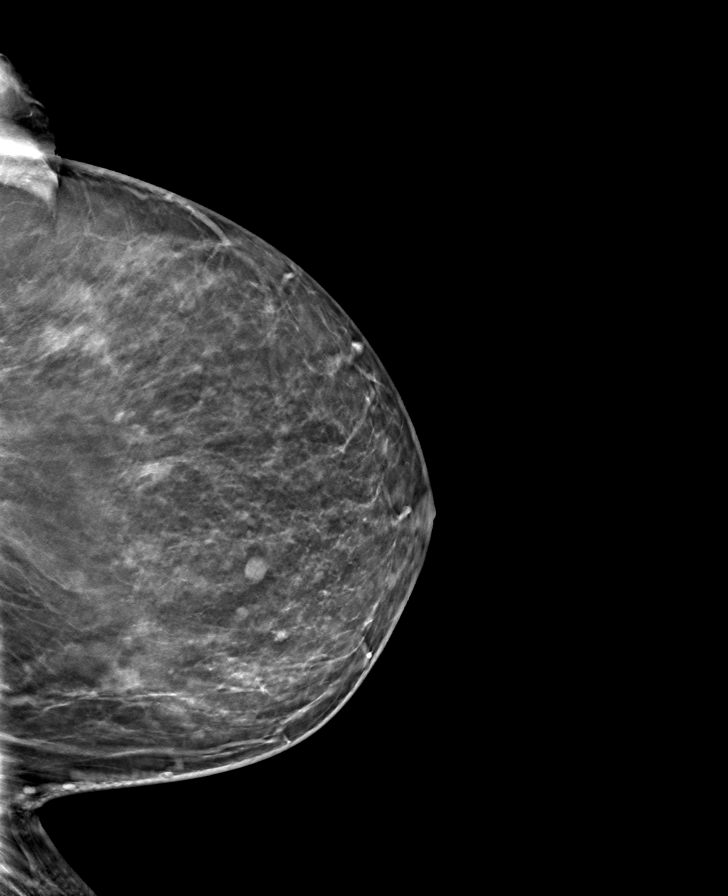

[L MLO tomo · tomo slice 48/95.0]
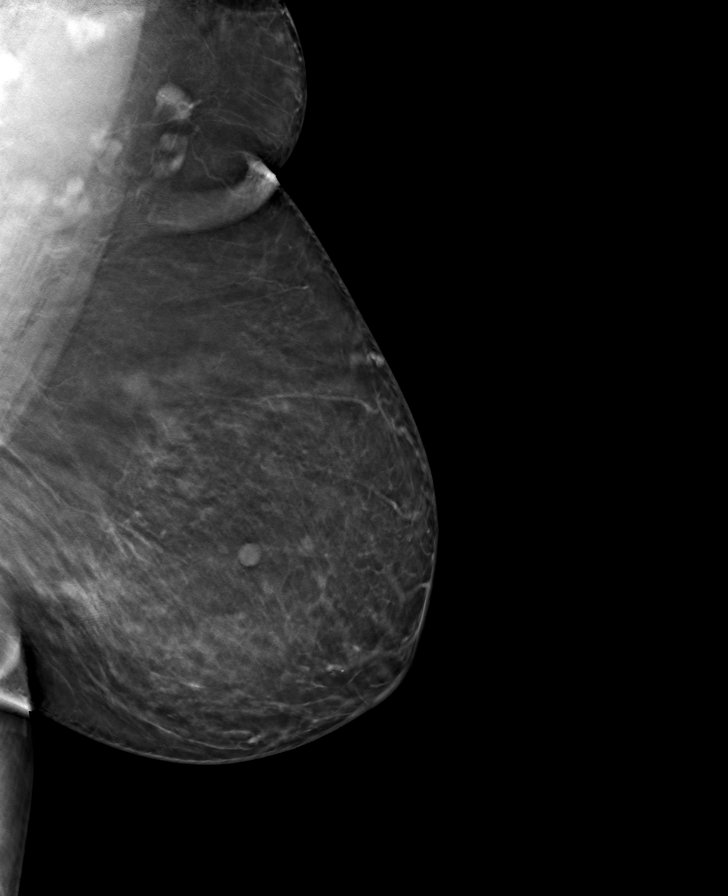

[8 of 24 positions shown; findings below may reference images not displayed]

ACR Breast Density Category b: There are scattered areas of
fibroglandular density.
FINDINGS: In the right breast possible mass requires further evaluation.

In the left breast possible masses require further evaluation.
IMPRESSION: Further evaluation is suggested for possible possible mass in the
right breast.

Further evaluation is suggested for possible possible masses in the
left breast.

RECOMMENDATION:
Diagnostic mammogram and possibly ultrasound of both breasts.
(Code:[id number]-[id number])

The patient will be contacted regarding the findings, and additional
imaging will be scheduled.

BI-RADS CATEGORY  0: Incomplete. Need additional imaging evaluation
and/or prior mammograms for comparison.

## 2022-08-12 ENCOUNTER — Ambulatory Visit: Payer: Medicaid Other

## 2022-08-12 DIAGNOSIS — M6281 Muscle weakness (generalized): Secondary | ICD-10-CM

## 2022-08-12 DIAGNOSIS — M542 Cervicalgia: Secondary | ICD-10-CM

## 2022-08-12 NOTE — Therapy (Signed)
Patient arrived to therapy with cold/flu like symptoms after traveling [place] the [place]. She stated that her neck is feeling much better and she is ready to discharge, but she doesn't think she should be seen today.   Final HEP and therabands given, she should continue to improve with HEP compliance.   Ward Chatters PT  08/12/22 2:53 PM

## 2022-08-15 DIAGNOSIS — Z419 Encounter for procedure for purposes other than remedying health state, unspecified: Secondary | ICD-10-CM | POA: Diagnosis not present

## 2022-09-15 DIAGNOSIS — Z419 Encounter for procedure for purposes other than remedying health state, unspecified: Secondary | ICD-10-CM | POA: Diagnosis not present

## 2022-10-15 DIAGNOSIS — Z419 Encounter for procedure for purposes other than remedying health state, unspecified: Secondary | ICD-10-CM | POA: Diagnosis not present

## 2022-11-15 DIAGNOSIS — Z419 Encounter for procedure for purposes other than remedying health state, unspecified: Secondary | ICD-10-CM | POA: Diagnosis not present

## 2022-12-16 DIAGNOSIS — Z419 Encounter for procedure for purposes other than remedying health state, unspecified: Secondary | ICD-10-CM | POA: Diagnosis not present

## 2023-01-14 DIAGNOSIS — Z419 Encounter for procedure for purposes other than remedying health state, unspecified: Secondary | ICD-10-CM | POA: Diagnosis not present

## 2023-02-14 DIAGNOSIS — Z419 Encounter for procedure for purposes other than remedying health state, unspecified: Secondary | ICD-10-CM | POA: Diagnosis not present

## 2023-03-16 DIAGNOSIS — Z419 Encounter for procedure for purposes other than remedying health state, unspecified: Secondary | ICD-10-CM | POA: Diagnosis not present

## 2023-04-16 DIAGNOSIS — Z419 Encounter for procedure for purposes other than remedying health state, unspecified: Secondary | ICD-10-CM | POA: Diagnosis not present

## 2023-05-16 DIAGNOSIS — Z419 Encounter for procedure for purposes other than remedying health state, unspecified: Secondary | ICD-10-CM | POA: Diagnosis not present

## 2023-06-16 DIAGNOSIS — Z419 Encounter for procedure for purposes other than remedying health state, unspecified: Secondary | ICD-10-CM | POA: Diagnosis not present

## 2023-07-17 DIAGNOSIS — Z419 Encounter for procedure for purposes other than remedying health state, unspecified: Secondary | ICD-10-CM | POA: Diagnosis not present

## 2023-08-09 DIAGNOSIS — E039 Hypothyroidism, unspecified: Secondary | ICD-10-CM | POA: Diagnosis not present

## 2023-08-09 DIAGNOSIS — J069 Acute upper respiratory infection, unspecified: Secondary | ICD-10-CM | POA: Diagnosis not present

## 2023-08-16 DIAGNOSIS — Z419 Encounter for procedure for purposes other than remedying health state, unspecified: Secondary | ICD-10-CM | POA: Diagnosis not present

## 2023-08-24 DIAGNOSIS — Z0000 Encounter for general adult medical examination without abnormal findings: Secondary | ICD-10-CM | POA: Diagnosis not present

## 2023-08-24 DIAGNOSIS — E559 Vitamin D deficiency, unspecified: Secondary | ICD-10-CM | POA: Diagnosis not present

## 2023-08-24 DIAGNOSIS — E038 Other specified hypothyroidism: Secondary | ICD-10-CM | POA: Diagnosis not present

## 2023-08-24 DIAGNOSIS — E1165 Type 2 diabetes mellitus with hyperglycemia: Secondary | ICD-10-CM | POA: Diagnosis not present

## 2023-08-24 DIAGNOSIS — E7849 Other hyperlipidemia: Secondary | ICD-10-CM | POA: Diagnosis not present

## 2023-08-24 DIAGNOSIS — Z124 Encounter for screening for malignant neoplasm of cervix: Secondary | ICD-10-CM | POA: Diagnosis not present

## 2023-08-24 DIAGNOSIS — Z1239 Encounter for other screening for malignant neoplasm of breast: Secondary | ICD-10-CM | POA: Diagnosis not present

## 2023-08-24 DIAGNOSIS — E039 Hypothyroidism, unspecified: Secondary | ICD-10-CM | POA: Diagnosis not present

## 2023-10-17 ENCOUNTER — Ambulatory Visit: Payer: Medicaid Other | Admitting: Obstetrics & Gynecology

## 2023-10-19 ENCOUNTER — Ambulatory Visit: Payer: Medicaid Other | Admitting: Obstetrics & Gynecology

## 2024-01-12 ENCOUNTER — Other Ambulatory Visit: Payer: Self-pay | Admitting: Internal Medicine

## 2024-01-12 DIAGNOSIS — Z1231 Encounter for screening mammogram for malignant neoplasm of breast: Secondary | ICD-10-CM

## 2024-01-13 ENCOUNTER — Ambulatory Visit
Admission: RE | Admit: 2024-01-13 | Discharge: 2024-01-13 | Disposition: A | Discharge status: Home / Self Care | Payer: Self-pay | Source: Ambulatory Visit | Attending: Internal Medicine | Admitting: Internal Medicine

## 2024-01-13 DIAGNOSIS — Z1231 Encounter for screening mammogram for malignant neoplasm of breast: Secondary | ICD-10-CM

## 2024-05-23 ENCOUNTER — Other Ambulatory Visit (HOSPITAL_COMMUNITY)
Admission: RE | Admit: 2024-05-23 | Discharge: 2024-05-23 | Disposition: A | Discharge status: Home / Self Care | Source: Ambulatory Visit | Attending: Obstetrics and Gynecology | Admitting: Obstetrics and Gynecology

## 2024-05-23 ENCOUNTER — Ambulatory Visit (INDEPENDENT_AMBULATORY_CARE_PROVIDER_SITE_OTHER): Admitting: Obstetrics and Gynecology

## 2024-05-23 ENCOUNTER — Encounter: Payer: Self-pay | Admitting: Obstetrics and Gynecology

## 2024-05-23 VITALS — BP 148/82 | HR 79 | Ht 64.0 in | Wt 184.0 lb

## 2024-05-23 DIAGNOSIS — Z9889 Other specified postprocedural states: Secondary | ICD-10-CM | POA: Diagnosis not present

## 2024-05-23 DIAGNOSIS — Z01419 Encounter for gynecological examination (general) (routine) without abnormal findings: Secondary | ICD-10-CM | POA: Diagnosis not present

## 2024-05-23 DIAGNOSIS — D069 Carcinoma in situ of cervix, unspecified: Secondary | ICD-10-CM | POA: Insufficient documentation

## 2024-05-23 DIAGNOSIS — R87810 Cervical high risk human papillomavirus (HPV) DNA test positive: Secondary | ICD-10-CM | POA: Insufficient documentation

## 2024-05-23 DIAGNOSIS — R87610 Atypical squamous cells of undetermined significance on cytologic smear of cervix (ASC-US): Secondary | ICD-10-CM | POA: Insufficient documentation

## 2024-05-23 NOTE — Progress Notes (Signed)
 Pt is in office for routine exam, no concerns today. Pt had mammo in March - normal.

## 2024-05-23 NOTE — Progress Notes (Signed)
 ANNUAL EXAM Patient name: Yesenia Salas MRN [id number]  Date of birth: July 14, 1961 Chief Complaint:   Annual Exam  History of Present Illness:   Yesenia Salas is a 63 y.o. [phone] being seen today for a routine annual exam.  Current complaints: none  Menstrual concerns? No  postmenopausal Breast or nipple changes? No normal mammogram earlier this year Contraception use?  Not sexually active Sexually active? No   No LMP recorded. Patient is postmenopausal.   The pregnancy intention screening data noted above was reviewed. Potential methods of contraception were discussed. The patient elected to proceed with No data recorded.   Last pap     Component Value Date/Time   DIAGPAP (A) 08/10/2018 0000    ATYPICAL SQUAMOUS CELLS OF UNDETERMINED SIGNIFICANCE (ASC-US ).   ADEQPAP (A) 08/10/2018 0000    Satisfactory for evaluation  endocervical/transformation zone component PRESENT.   COLPOSCOPY: high grade dysplasia (CIN 2) at 12 oclock (08/2018) LEEP: CIN 2 with negative margins, CIN 1 at ectocervical margin (10/2018)  Last mammogram: 12/2023 BIRADS 1.  Last colonoscopy: Not seen on file.      05/23/2024    4:08 PM 08/10/2018   10:10 AM  Depression screen PHQ 2/9  Decreased Interest 0 0  Down, Depressed, Hopeless 0 0  PHQ - 2 Score 0 0  Altered sleeping 0 0  Tired, decreased energy 1 1  Change in appetite 0 0  Feeling bad or failure about yourself  0 0  Trouble concentrating 0 0  Moving slowly or fidgety/restless 0 0  Suicidal thoughts 0 0  PHQ-9 Score 1 1  Difficult doing work/chores  Not difficult at all        05/23/2024    4:09 PM  GAD 7 : Generalized Anxiety Score  Nervous, Anxious, on Edge 0  Control/stop worrying 0  Worry too much - different things 0  Trouble relaxing 0  Restless 0  Easily annoyed or irritable 0  Afraid - awful might happen 0  Total GAD 7 Score 0     Review of Systems:   Pertinent items are noted in HPI Denies any headaches,  blurred vision, fatigue, shortness of breath, chest pain, abdominal pain, abnormal vaginal discharge/itching/odor/irritation, problems with periods, bowel movements, urination, or intercourse unless otherwise stated above. Pertinent History Reviewed:  Reviewed past medical,surgical, social and family history.  Reviewed problem list, medications and allergies. Physical Assessment:   Vitals:   05/23/24 1600 05/23/24 1605  BP: (!) 155/93 (!) 148/82  Pulse: 72 79  Weight: 184 lb (83.5 kg)   Height: 5' 4 (1.626 m)   Body mass index is 31.58 kg/m.        Physical Examination:   General appearance - well appearing, and in no distress  Mental status - alert, oriented to person, place, and time  Psych:  She has a normal mood and affect  Skin - warm and dry, normal color, no suspicious lesions noted  Chest - effort normal, all lung fields clear to auscultation bilaterally  Heart - normal rate and regular rhythm  Breasts - breasts appear normal  Pelvic -  VULVA: normal appearing vulva with no masses, tenderness or lesions   VAGINA: normal appearing vagina with normal color and discharge, no lesions   CERVIX: difficult to visualize, atrophic and shortened  Thin prep pap is done with HR HPV cotesting  UTERUS: uterus is felt to be normal size, shape, consistency and nontender   ADNEXA: No adnexal masses or tenderness noted.  Extremities:  No swelling or varicosities noted  Chaperone present for exam  No results found for this or any previous visit (from the past 24 hours).    Assessment & Plan:  1. Well woman exam with routine gynecological exam (Primary) - Cervical cancer screening: Discussed guidelines. Pap with HPV collected - Breast Health: Encouraged self breast awareness/SBE. Discussed limits of clinical breast exam for detecting breast cancer. Discussed importance of annual MXR. MXR is up to date: 2025 - Climacteric/Sexual health: Reviewed typical and atypical symptoms of  menopause/peri-menopause. Discussed PMB and to call if any amount of spotting.  - Colonoscopy: Per PCP - F/U 12 months and prn  2. History of loop electrosurgical excision procedure (LEEP) 3. 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   Meds: No orders of the defined types were placed in this encounter.   Follow-up: No follow-ups on file.  Carter Quarry, MD 05/23/2024 4:38 PM

## 2024-05-29 ENCOUNTER — Encounter (HOSPITAL_BASED_OUTPATIENT_CLINIC_OR_DEPARTMENT_OTHER): Payer: Self-pay | Admitting: Emergency Medicine

## 2024-05-29 ENCOUNTER — Emergency Department (HOSPITAL_BASED_OUTPATIENT_CLINIC_OR_DEPARTMENT_OTHER)
Admission: EM | Admit: 2024-05-29 | Discharge: 2024-05-29 | Disposition: A | Discharge status: Home / Self Care | Attending: Emergency Medicine | Admitting: Emergency Medicine

## 2024-05-29 ENCOUNTER — Other Ambulatory Visit: Payer: Self-pay

## 2024-05-29 DIAGNOSIS — M79672 Pain in left foot: Secondary | ICD-10-CM | POA: Diagnosis present

## 2024-05-29 DIAGNOSIS — M109 Gout, unspecified: Secondary | ICD-10-CM

## 2024-05-29 DIAGNOSIS — R2242 Localized swelling, mass and lump, left lower limb: Secondary | ICD-10-CM | POA: Insufficient documentation

## 2024-05-29 DIAGNOSIS — I10 Essential (primary) hypertension: Secondary | ICD-10-CM

## 2024-05-29 MED ORDER — PREDNISONE 50 MG PO TABS
60.0000 mg | ORAL_TABLET | Freq: Once | ORAL | Status: AC
  Administered 2024-05-29: 60 mg via ORAL
  Filled 2024-05-29: qty 1

## 2024-05-29 MED ORDER — COLCHICINE 0.6 MG PO TABS
0.6000 mg | ORAL_TABLET | Freq: Every day | ORAL | 0 refills | Status: AC
Start: 2024-05-29 — End: ?

## 2024-05-29 MED ORDER — PREDNISONE 50 MG PO TABS: ORAL_TABLET | ORAL | 0 refills | Status: AC

## 2024-05-29 MED ORDER — AMLODIPINE BESYLATE 5 MG PO TABS: 5.0000 mg | ORAL_TABLET | Freq: Every day | ORAL | 0 refills | Status: AC

## 2024-05-29 MED ORDER — OXYCODONE-ACETAMINOPHEN 5-325 MG PO TABS: 1.0000 | ORAL_TABLET | Freq: Four times a day (QID) | ORAL | 0 refills | Status: AC | PRN

## 2024-05-29 NOTE — Discharge Instructions (Addendum)
 Call your doctor today to schedule a follow-up visit for later this week for your blood pressure as well as a recheck of your gout

## 2024-05-29 NOTE — ED Provider Notes (Addendum)
 [place] EMERGENCY DEPARTMENT AT [facility] Provider Note   CSN: [id number] Arrival date & time: 05/29/24  9241     Patient presents with: No chief complaint on file.   Yesenia Salas is a 63 y.o. female.   63 year old female presents with pain to her left first MTP joint.  Denies any history of trauma to it.  No prior history of gout.  States that the pain is characterized as sharp and worse with walking.  Denies any fever or chills.  No other joint pain at this time.  Has been using over-the-counter medications without relief       Prior to Admission medications   Medication Sig Start Date End Date Taking? Authorizing Provider  atorvastatin  (LIPITOR) 80 MG tablet Take 1 tablet (80 mg total) by mouth daily at 6 PM. 05/17/19 06/16/19  Cindy Garnette POUR, MD  Levothyroxine  Sodium 125 MCG CAPS Take 112 mcg by mouth daily before breakfast.     [provider]  meloxicam  (MOBIC ) 15 MG tablet Take 1 tablet (15 mg total) by mouth daily. 05/25/22   Leonce Katz, DO    Allergies: Patient has no known allergies.    Review of Systems  All other systems reviewed and are negative.   Updated Vital Signs BP (!) 186/95   Pulse 84   Temp 98.3 F (36.8 C) (Oral)   Resp 16   SpO2 100%   Physical Exam Vitals and nursing note reviewed.  Constitutional:      General: She is not in acute distress.    Appearance: Normal appearance. She is well-developed. She is not toxic-appearing.  HENT:     Head: Normocephalic and atraumatic.  Eyes:     General: Lids are normal.     Conjunctiva/sclera: Conjunctivae normal.     Pupils: Pupils are equal, round, and reactive to light.  Neck:     Thyroid : No thyroid  mass.     Trachea: No tracheal deviation.  Cardiovascular:     Rate and Rhythm: Normal rate and regular rhythm.     Heart sounds: Normal heart sounds. No murmur heard.    No gallop.  Pulmonary:     Effort: Pulmonary effort is normal. No respiratory distress.      Breath sounds: Normal breath sounds. No stridor. No decreased breath sounds, wheezing, rhonchi or rales.  Abdominal:     General: There is no distension.     Palpations: Abdomen is soft.     Tenderness: There is no abdominal tenderness. There is no rebound.  Musculoskeletal:        General: No tenderness. Normal range of motion.     Cervical back: Normal range of motion and neck supple.     Comments: Tenderness and erythema noted at first left MTP.  Skin is tender to the touch.  Full range of motion at the left great toe.  Neurovasc intact  Skin:    General: Skin is warm and dry.     Findings: No abrasion or rash.  Neurological:     Mental Status: She is alert and oriented to person, place, and time. Mental status is at baseline.     GCS: GCS eye subscore is 4. GCS verbal subscore is 5. GCS motor subscore is 6.     Cranial Nerves: No cranial nerve deficit.     Sensory: No sensory deficit.     Motor: Motor function is intact.  Psychiatric:        Attention and Perception: Attention  normal.        Speech: Speech normal.        Behavior: Behavior normal.     (all labs ordered are listed, but only abnormal results are displayed) Labs Reviewed - No data to display  EKG: None  Radiology: No results found.   Procedures   Medications Ordered in the ED - No data to display                                  Medical Decision Making  Patient likely gout.  Patient given first dose of prednisone  here will prescribe prednisone  and colchicine .  Patient also mildly hypertensive here and will start patient on amlodipine .  She will need to follow-up with her doctor this week.  Patient voiced understanding   Final diagnoses:  None    ED Discharge Orders     None          Dasie Faden, MD 05/29/24 0827    Dasie Faden, MD 05/29/24 0827    Dasie Faden, MD 05/29/24 0830    Dasie Faden, MD 05/29/24 0830

## 2024-05-29 NOTE — ED Notes (Signed)
 DC paperwork given and verbally understood.

## 2024-05-29 NOTE — ED Triage Notes (Signed)
 Pt c/o L foot pain and swelling since Saturday, denies injury, denies PMH of same.

## 2024-06-01 ENCOUNTER — Ambulatory Visit: Payer: Self-pay | Admitting: Obstetrics and Gynecology

## 2024-06-01 LAB — CYTOLOGY - PAP
Adequacy: ABSENT
Comment: NEGATIVE
Diagnosis: NEGATIVE
High risk HPV: NEGATIVE
